# Patient Record
Sex: Male | Born: 1990 | Race: White | Hispanic: No | Marital: Single | Smoking: Current every day smoker
Health system: Southern US, Community
[De-identification: ages and names within clinical notes are randomized; demographics above are authoritative.]

## PROBLEM LIST (undated history)

## (undated) DIAGNOSIS — W3400XA Accidental discharge from unspecified firearms or gun, initial encounter: Secondary | ICD-10-CM

## (undated) DIAGNOSIS — M792 Neuralgia and neuritis, unspecified: Secondary | ICD-10-CM

## (undated) DIAGNOSIS — S32009A Unspecified fracture of unspecified lumbar vertebra, initial encounter for closed fracture: Secondary | ICD-10-CM

## (undated) DIAGNOSIS — S36113A Laceration of liver, unspecified degree, initial encounter: Secondary | ICD-10-CM

## (undated) DIAGNOSIS — F1290 Cannabis use, unspecified, uncomplicated: Secondary | ICD-10-CM

---
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
## 2014-08-05 ENCOUNTER — Inpatient Hospital Stay (HOSPITAL_COMMUNITY)
Admission: EM | Admit: 2014-08-05 | Discharge: 2014-08-09 | DRG: 405 | Disposition: A | Discharge status: Home / Self Care | Payer: Self-pay | Attending: General Surgery | Admitting: General Surgery

## 2014-08-05 ENCOUNTER — Emergency Department (HOSPITAL_COMMUNITY): Payer: Self-pay

## 2014-08-05 ENCOUNTER — Encounter (HOSPITAL_COMMUNITY): Payer: Self-pay

## 2014-08-05 ENCOUNTER — Encounter (HOSPITAL_COMMUNITY): Admission: EM | Disposition: A | Discharge status: Home / Self Care | Payer: Self-pay | Source: Home / Self Care

## 2014-08-05 ENCOUNTER — Emergency Department (HOSPITAL_COMMUNITY): Payer: Self-pay | Admitting: Anesthesiology

## 2014-08-05 DIAGNOSIS — G629 Polyneuropathy, unspecified: Secondary | ICD-10-CM | POA: Diagnosis not present

## 2014-08-05 DIAGNOSIS — S32009A Unspecified fracture of unspecified lumbar vertebra, initial encounter for closed fracture: Secondary | ICD-10-CM

## 2014-08-05 DIAGNOSIS — S31139A Puncture wound of abdominal wall without foreign body, unspecified quadrant without penetration into peritoneal cavity, initial encounter: Secondary | ICD-10-CM

## 2014-08-05 DIAGNOSIS — W3400XA Accidental discharge from unspecified firearms or gun, initial encounter: Secondary | ICD-10-CM

## 2014-08-05 DIAGNOSIS — M25559 Pain in unspecified hip: Secondary | ICD-10-CM

## 2014-08-05 DIAGNOSIS — M792 Neuralgia and neuritis, unspecified: Secondary | ICD-10-CM | POA: Diagnosis present

## 2014-08-05 DIAGNOSIS — S36113A Laceration of liver, unspecified degree, initial encounter: Secondary | ICD-10-CM

## 2014-08-05 DIAGNOSIS — Y92009 Unspecified place in unspecified non-institutional (private) residence as the place of occurrence of the external cause: Secondary | ICD-10-CM

## 2014-08-05 DIAGNOSIS — Z9889 Other specified postprocedural states: Secondary | ICD-10-CM

## 2014-08-05 DIAGNOSIS — S32039B Unspecified fracture of third lumbar vertebra, initial encounter for open fracture: Secondary | ICD-10-CM | POA: Diagnosis present

## 2014-08-05 DIAGNOSIS — S31619A Laceration without foreign body of abdominal wall, unspecified quadrant with penetration into peritoneal cavity, initial encounter: Secondary | ICD-10-CM | POA: Diagnosis present

## 2014-08-05 HISTORY — DX: Laceration of liver, unspecified degree, initial encounter: S36.113A

## 2014-08-05 HISTORY — DX: Neuralgia and neuritis, unspecified: M79.2

## 2014-08-05 HISTORY — DX: Accidental discharge from unspecified firearms or gun, initial encounter: W34.00XA

## 2014-08-05 HISTORY — PX: LAPAROTOMY: SHX154

## 2014-08-05 HISTORY — DX: Unspecified fracture of unspecified lumbar vertebra, initial encounter for closed fracture: S32.009A

## 2014-08-05 LAB — COMPREHENSIVE METABOLIC PANEL
ALT: 157 U/L — AB (ref 17–63)
AST: 142 U/L — ABNORMAL HIGH (ref 15–41)
Albumin: 4.4 g/dL (ref 3.5–5.0)
Alkaline Phosphatase: 74 U/L (ref 38–126)
Anion gap: 13 (ref 5–15)
BUN: 15 mg/dL (ref 6–20)
CO2: 24 mmol/L (ref 22–32)
CREATININE: 1.1 mg/dL (ref 0.61–1.24)
Calcium: 9.4 mg/dL (ref 8.9–10.3)
Chloride: 101 mmol/L (ref 101–111)
GFR calc Af Amer: >60 mL/min (ref >60)
GFR calc non Af Amer: >60 mL/min (ref >60)
GLUCOSE: 130 mg/dL — AB (ref 65–99)
Potassium: 3.5 mmol/L (ref 3.5–5.1)
SODIUM: 138 mmol/L (ref 135–145)
Total Bilirubin: 0.8 mg/dL (ref 0.3–1.2)
Total Protein: 7.4 g/dL (ref 6.5–8.1)

## 2014-08-05 LAB — I-STAT CHEM 8, ED
BUN: 19 mg/dL (ref 6–20)
Calcium, Ion: 1.11 mmol/L — ABNORMAL LOW (ref 1.12–1.23)
Chloride: 100 mmol/L — ABNORMAL LOW (ref 101–111)
Creatinine, Ser: 1 mg/dL (ref 0.61–1.24)
GLUCOSE: 134 mg/dL — AB (ref 65–99)
HCT: 47 % (ref 39.0–52.0)
HEMOGLOBIN: 16 g/dL (ref 13.0–17.0)
Potassium: 3.2 mmol/L — ABNORMAL LOW (ref 3.5–5.1)
SODIUM: 140 mmol/L (ref 135–145)
TCO2: 24 mmol/L (ref 0–100)

## 2014-08-05 LAB — CBC
HCT: 42.5 % (ref 39.0–52.0)
HEMOGLOBIN: 15.2 g/dL (ref 13.0–17.0)
MCH: 31 pg (ref 26.0–34.0)
MCHC: 35.8 g/dL (ref 30.0–36.0)
MCV: 86.6 fL (ref 78.0–100.0)
Platelets: 236 10*3/uL (ref 150–400)
RBC: 4.91 MIL/uL (ref 4.22–5.81)
RDW: 12.5 % (ref 11.5–15.5)
WBC: 13.1 10*3/uL — AB (ref 4.0–10.5)

## 2014-08-05 LAB — I-STAT CG4 LACTIC ACID, ED: Lactic Acid, Venous: 2.31 mmol/L (ref 0.5–2.0)

## 2014-08-05 LAB — PROTIME-INR
INR: 1.24 (ref 0.00–1.49)
Prothrombin Time: 15.7 seconds — ABNORMAL HIGH (ref 11.6–15.2)

## 2014-08-05 LAB — ETHANOL

## 2014-08-05 LAB — CDS SEROLOGY

## 2014-08-05 SURGERY — LAPAROTOMY, EXPLORATORY
Anesthesia: General | Site: Abdomen

## 2014-08-05 MED ORDER — FENTANYL CITRATE (PF) 250 MCG/5ML IJ SOLN
INTRAMUSCULAR | Status: AC
  Filled 2014-08-05: qty 5

## 2014-08-05 MED ORDER — ROCURONIUM BROMIDE 100 MG/10ML IV SOLN
INTRAVENOUS | Status: DC | PRN
  Administered 2014-08-05: 50 mg via INTRAVENOUS

## 2014-08-05 MED ORDER — 0.9 % SODIUM CHLORIDE (POUR BTL) OPTIME
TOPICAL | Status: DC | PRN
  Administered 2014-08-05: 2000 mL

## 2014-08-05 MED ORDER — MIDAZOLAM HCL 2 MG/2ML IJ SOLN
INTRAMUSCULAR | Status: AC
  Filled 2014-08-05: qty 2

## 2014-08-05 MED ORDER — DEXTROSE 5 % IV SOLN
2.0000 g | INTRAVENOUS | Status: DC | PRN
  Administered 2014-08-05: 2 g via INTRAVENOUS

## 2014-08-05 MED ORDER — HYDROMORPHONE HCL 1 MG/ML IJ SOLN
0.2500 mg | INTRAMUSCULAR | Status: DC | PRN
  Administered 2014-08-05 – 2014-08-06 (×4): 0.5 mg via INTRAVENOUS

## 2014-08-05 MED ORDER — HEMOSTATIC AGENTS (NO CHARGE) OPTIME
TOPICAL | Status: DC | PRN
  Administered 2014-08-05: 2 via TOPICAL

## 2014-08-05 MED ORDER — LACTATED RINGERS IV SOLN
INTRAVENOUS | Status: DC | PRN
  Administered 2014-08-05 (×2): via INTRAVENOUS

## 2014-08-05 MED ORDER — SODIUM CHLORIDE 0.9 % IV SOLN
INTRAVENOUS | Status: DC | PRN
  Administered 2014-08-05: 23:00:00 via INTRAVENOUS

## 2014-08-05 MED ORDER — PROMETHAZINE HCL 25 MG/ML IJ SOLN
6.2500 mg | INTRAMUSCULAR | Status: DC | PRN
Start: 2014-08-05 — End: 2014-08-06
  Filled 2014-08-05: qty 1

## 2014-08-05 MED ORDER — LIDOCAINE HCL (CARDIAC) 20 MG/ML IV SOLN
INTRAVENOUS | Status: DC | PRN
  Administered 2014-08-05: 100 mg via INTRAVENOUS

## 2014-08-05 MED ORDER — FENTANYL CITRATE (PF) 250 MCG/5ML IJ SOLN
INTRAMUSCULAR | Status: DC | PRN
  Administered 2014-08-05: 100 ug via INTRAVENOUS
  Administered 2014-08-05: 150 ug via INTRAVENOUS

## 2014-08-05 MED ORDER — SODIUM CHLORIDE 0.9 % IV BOLUS (SEPSIS)
1000.0000 mL | Freq: Once | INTRAVENOUS | Status: AC
  Administered 2014-08-05: 1000 mL via INTRAVENOUS

## 2014-08-05 MED ORDER — CEFOTETAN DISODIUM-DEXTROSE 2-2.08 GM-% IV SOLR
INTRAVENOUS | Status: AC
  Filled 2014-08-05: qty 50

## 2014-08-05 MED ORDER — HYDROMORPHONE HCL 1 MG/ML IJ SOLN
INTRAMUSCULAR | Status: AC
  Administered 2014-08-06: 2 mg via INTRAVENOUS
  Filled 2014-08-05: qty 1

## 2014-08-05 MED ORDER — SUGAMMADEX SODIUM 500 MG/5ML IV SOLN
INTRAVENOUS | Status: AC
  Filled 2014-08-05: qty 5

## 2014-08-05 MED ORDER — ROCURONIUM BROMIDE 100 MG/10ML IV SOLN: INTRAVENOUS | Status: DC | PRN

## 2014-08-05 MED ORDER — SUCCINYLCHOLINE CHLORIDE 20 MG/ML IJ SOLN
INTRAMUSCULAR | Status: DC | PRN
  Administered 2014-08-05: 120 mg via INTRAVENOUS

## 2014-08-05 MED ORDER — MIDAZOLAM HCL 5 MG/5ML IJ SOLN
INTRAMUSCULAR | Status: DC | PRN
  Administered 2014-08-05: 2 mg via INTRAVENOUS

## 2014-08-05 MED ORDER — SUGAMMADEX SODIUM 200 MG/2ML IV SOLN
INTRAVENOUS | Status: DC | PRN
  Administered 2014-08-05: 260 mg via INTRAVENOUS

## 2014-08-05 MED ORDER — PROPOFOL 10 MG/ML IV BOLUS
INTRAVENOUS | Status: DC | PRN
  Administered 2014-08-05: 200 mg via INTRAVENOUS

## 2014-08-05 SURGICAL SUPPLY — 46 items
BLADE SURG ROTATE 9660 (MISCELLANEOUS) IMPLANT
CANISTER SUCTION 2500CC (MISCELLANEOUS) ×3 IMPLANT
CHLORAPREP W/TINT 26ML (MISCELLANEOUS) ×3 IMPLANT
COVER MAYO STAND STRL (DRAPES) IMPLANT
COVER SURGICAL LIGHT HANDLE (MISCELLANEOUS) ×3 IMPLANT
DRAPE LAPAROSCOPIC ABDOMINAL (DRAPES) ×3 IMPLANT
DRAPE PROXIMA HALF (DRAPES) IMPLANT
DRAPE UTILITY XL STRL (DRAPES) ×6 IMPLANT
DRAPE WARM FLUID 44X44 (DRAPE) ×3 IMPLANT
DRSG OPSITE POSTOP 4X10 (GAUZE/BANDAGES/DRESSINGS) ×3 IMPLANT
DRSG OPSITE POSTOP 4X8 (GAUZE/BANDAGES/DRESSINGS) IMPLANT
ELECT BLADE 6.5 EXT (BLADE) IMPLANT
ELECT CAUTERY BLADE 6.4 (BLADE) ×6 IMPLANT
ELECT REM PT RETURN 9FT ADLT (ELECTROSURGICAL) ×3
ELECTRODE REM PT RTRN 9FT ADLT (ELECTROSURGICAL) ×1 IMPLANT
GAUZE SPONGE 2X2 8PLY STRL LF (GAUZE/BANDAGES/DRESSINGS) ×1 IMPLANT
GLOVE BIO SURGEON STRL SZ7.5 (GLOVE) ×3 IMPLANT
GLOVE BIOGEL PI IND STRL 8 (GLOVE) ×1 IMPLANT
GLOVE BIOGEL PI INDICATOR 8 (GLOVE) ×2
GOWN STRL REUS W/ TWL LRG LVL3 (GOWN DISPOSABLE) ×2 IMPLANT
GOWN STRL REUS W/ TWL XL LVL3 (GOWN DISPOSABLE) ×1 IMPLANT
GOWN STRL REUS W/TWL LRG LVL3 (GOWN DISPOSABLE) ×4
GOWN STRL REUS W/TWL XL LVL3 (GOWN DISPOSABLE) ×2
KIT BASIN OR (CUSTOM PROCEDURE TRAY) ×3 IMPLANT
KIT ROOM TURNOVER OR (KITS) ×3 IMPLANT
LIGASURE IMPACT 36 18CM CVD LR (INSTRUMENTS) IMPLANT
NS IRRIG 1000ML POUR BTL (IV SOLUTION) ×6 IMPLANT
PACK GENERAL/GYN (CUSTOM PROCEDURE TRAY) ×3 IMPLANT
PAD ARMBOARD 7.5X6 YLW CONV (MISCELLANEOUS) ×6 IMPLANT
PENCIL BUTTON HOLSTER BLD 10FT (ELECTRODE) IMPLANT
SPECIMEN JAR LARGE (MISCELLANEOUS) IMPLANT
SPONGE GAUZE 2X2 STER 10/PKG (GAUZE/BANDAGES/DRESSINGS) ×2
SPONGE LAP 18X18 X RAY DECT (DISPOSABLE) IMPLANT
STAPLER VISISTAT 35W (STAPLE) ×3 IMPLANT
SUCTION POOLE TIP (SUCTIONS) ×3 IMPLANT
SUT PDS AB 1 TP1 96 (SUTURE) ×6 IMPLANT
SUT SILK 2 0 SH CR/8 (SUTURE) ×3 IMPLANT
SUT SILK 2 0 TIES 10X30 (SUTURE) ×3 IMPLANT
SUT SILK 3 0 SH CR/8 (SUTURE) ×3 IMPLANT
SUT SILK 3 0 TIES 10X30 (SUTURE) ×3 IMPLANT
TAPE CLOTH SURG 4X10 WHT LF (GAUZE/BANDAGES/DRESSINGS) ×3 IMPLANT
TOWEL OR 17X26 10 PK STRL BLUE (TOWEL DISPOSABLE) ×3 IMPLANT
TRAY FOLEY CATH 16FRSI W/METER (SET/KITS/TRAYS/PACK) IMPLANT
TUBE CONNECTING 12'X1/4 (SUCTIONS)
TUBE CONNECTING 12X1/4 (SUCTIONS) IMPLANT
YANKAUER SUCT BULB TIP NO VENT (SUCTIONS) IMPLANT

## 2014-08-05 NOTE — Anesthesia Procedure Notes (Signed)
Procedure Name: Intubation Date/Time: 08/05/2014 10:40 PM Performed by: Brien MatesMAHONY, Shyloh Krinke D Pre-anesthesia Checklist: Patient identified, Emergency Drugs available, Suction available, Patient being monitored and Timeout performed Patient Re-evaluated:Patient Re-evaluated prior to inductionOxygen Delivery Method: Circle system utilized Preoxygenation: Pre-oxygenation with 100% oxygen Intubation Type: Rapid sequence and Cricoid Pressure applied Laryngoscope Size: Mac and 3 Grade View: Grade I Tube type: Subglottic suction tube Tube size: 7.5 mm Number of attempts: 1 Airway Equipment and Method: Stylet Placement Confirmation: ETT inserted through vocal cords under direct vision,  positive ETCO2 and breath sounds checked- equal and bilateral Secured at: 23 cm Tube secured with: Tape Dental Injury: Teeth and Oropharynx as per pre-operative assessment
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
## 2014-08-05 NOTE — ED Notes (Signed)
Pt taken to OR. WDL, NAD. Stable at this time.

## 2014-08-05 NOTE — Transfer of Care (Signed)
Immediate Anesthesia Transfer of Care Note  Patient: Carl Thomas  Procedure(s) Performed: Procedure(s): EXPLORATORY LAPAROTOMY (N/A)  Patient Location: PACU  Anesthesia Type:General  Level of Consciousness: awake, alert  and oriented  Airway & Oxygen Therapy: Patient Spontanous Breathing  Post-op Assessment: Report given to RN and Post -op Vital signs reviewed and stable  Post vital signs: Reviewed and stable  Last Vitals:  Filed Vitals:   08/05/14 2345  BP: 155/106  Pulse: 98  Temp: 36.7 C  Resp: 22    Complications: No apparent anesthesia complications
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
## 2014-08-05 NOTE — ED Notes (Signed)
Second small wound located to right lower side of back
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
## 2014-08-05 NOTE — Anesthesia Preprocedure Evaluation (Addendum)
Anesthesia Evaluation  Patient identified by MRN, date of birth, ID band Patient awake    Reviewed: Allergy & Precautions, NPO status Preop documentation limited or incomplete due to emergent nature of procedure.  Airway Mallampati: II  TM Distance: >3 FB Neck ROM: Full    Dental   Pulmonary  breath sounds clear to auscultation        Cardiovascular Rhythm:Regular Rate:Normal     Neuro/Psych    GI/Hepatic   Endo/Other    Renal/GU      Musculoskeletal   Abdominal   Peds  Hematology   Anesthesia Other Findings   Reproductive/Obstetrics                             Anesthesia Physical Anesthesia Plan  ASA: III and emergent  Anesthesia Plan: General   Post-op Pain Management:    Induction: Intravenous and Rapid sequence  Airway Management Planned: Oral ETT  Additional Equipment:   Intra-op Plan:   Post-operative Plan: Extubation in OR and Possible Post-op intubation/ventilation  Informed Consent: I have reviewed the patients History and Physical, chart, labs and discussed the procedure including the risks, benefits and alternatives for the proposed anesthesia with the patient or authorized representative who has indicated his/her understanding and acceptance.   Dental advisory given  Plan Discussed with: CRNA  Anesthesia Plan Comments:        Anesthesia Quick Evaluation
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
## 2014-08-06 ENCOUNTER — Inpatient Hospital Stay (HOSPITAL_COMMUNITY): Payer: Self-pay

## 2014-08-06 ENCOUNTER — Encounter (HOSPITAL_COMMUNITY): Payer: Self-pay

## 2014-08-06 DIAGNOSIS — Z9889 Other specified postprocedural states: Secondary | ICD-10-CM

## 2014-08-06 LAB — URINALYSIS, ROUTINE W REFLEX MICROSCOPIC
Bilirubin Urine: NEGATIVE
Glucose, UA: NEGATIVE mg/dL
Ketones, ur: 15 mg/dL — AB
LEUKOCYTES UA: NEGATIVE
NITRITE: NEGATIVE
Protein, ur: 30 mg/dL — AB
SPECIFIC GRAVITY, URINE: 1.021 (ref 1.005–1.030)
UROBILINOGEN UA: 0.2 mg/dL (ref 0.0–1.0)
pH: 5 (ref 5.0–8.0)

## 2014-08-06 LAB — RAPID URINE DRUG SCREEN, HOSP PERFORMED
AMPHETAMINES: NOT DETECTED
Barbiturates: NOT DETECTED
Benzodiazepines: POSITIVE — AB
Cocaine: NOT DETECTED
Opiates: POSITIVE — AB
Tetrahydrocannabinol: POSITIVE — AB

## 2014-08-06 LAB — CBC
HEMATOCRIT: 40.7 % (ref 39.0–52.0)
Hemoglobin: 14.2 g/dL (ref 13.0–17.0)
MCH: 30.7 pg (ref 26.0–34.0)
MCHC: 34.9 g/dL (ref 30.0–36.0)
MCV: 87.9 fL (ref 78.0–100.0)
PLATELETS: 200 10*3/uL (ref 150–400)
RBC: 4.63 MIL/uL (ref 4.22–5.81)
RDW: 12.7 % (ref 11.5–15.5)
WBC: 20.9 10*3/uL — ABNORMAL HIGH (ref 4.0–10.5)

## 2014-08-06 LAB — TYPE AND SCREEN
ABO/RH(D): B POS
ANTIBODY SCREEN: NEGATIVE
UNIT DIVISION: 0
UNIT DIVISION: 0

## 2014-08-06 LAB — PREPARE FRESH FROZEN PLASMA
Unit division: 0
Unit division: 0

## 2014-08-06 LAB — BASIC METABOLIC PANEL
ANION GAP: 11 (ref 5–15)
BUN: 14 mg/dL (ref 6–20)
CHLORIDE: 102 mmol/L (ref 101–111)
CO2: 23 mmol/L (ref 22–32)
Calcium: 8.8 mg/dL — ABNORMAL LOW (ref 8.9–10.3)
Creatinine, Ser: 0.94 mg/dL (ref 0.61–1.24)
GFR calc non Af Amer: >60 mL/min (ref >60)
Glucose, Bld: 123 mg/dL — ABNORMAL HIGH (ref 65–99)
Potassium: 4.4 mmol/L (ref 3.5–5.1)
SODIUM: 136 mmol/L (ref 135–145)

## 2014-08-06 LAB — URINE MICROSCOPIC-ADD ON

## 2014-08-06 LAB — ABO/RH: ABO/RH(D): B POS

## 2014-08-06 MED ORDER — ONDANSETRON HCL 4 MG/2ML IJ SOLN
4.0000 mg | Freq: Four times a day (QID) | INTRAMUSCULAR | Status: DC | PRN
  Administered 2014-08-06 – 2014-08-07 (×3): 4 mg via INTRAVENOUS
  Filled 2014-08-06 (×3): qty 2

## 2014-08-06 MED ORDER — DEXTROSE-NACL 5-0.9 % IV SOLN
INTRAVENOUS | Status: DC
  Administered 2014-08-06 – 2014-08-07 (×4): via INTRAVENOUS

## 2014-08-06 MED ORDER — HYDROMORPHONE HCL 1 MG/ML IJ SOLN
0.5000 mg | INTRAMUSCULAR | Status: DC | PRN
  Administered 2014-08-06 – 2014-08-07 (×6): 2 mg via INTRAVENOUS
  Filled 2014-08-06 (×6): qty 2

## 2014-08-06 MED ORDER — NICOTINE 21 MG/24HR TD PT24
21.0000 mg | MEDICATED_PATCH | Freq: Every day | TRANSDERMAL | Status: DC
  Filled 2014-08-06 (×2): qty 1

## 2014-08-06 MED ORDER — HYDROCODONE-ACETAMINOPHEN 5-325 MG PO TABS
2.0000 | ORAL_TABLET | ORAL | Status: DC | PRN
  Administered 2014-08-06 (×3): 2 via ORAL
  Administered 2014-08-06: 1 via ORAL
  Administered 2014-08-07: 2 via ORAL
  Filled 2014-08-06 (×5): qty 2

## 2014-08-06 MED ORDER — IOHEXOL 300 MG/ML  SOLN
100.0000 mL | Freq: Once | INTRAMUSCULAR | Status: AC | PRN
  Administered 2014-08-06: 100 mL via INTRAVENOUS

## 2014-08-06 MED ORDER — DOUBLE ANTIBIOTIC 500-10000 UNIT/GM EX OINT
TOPICAL_OINTMENT | Freq: Two times a day (BID) | CUTANEOUS | Status: DC
  Administered 2014-08-06 – 2014-08-09 (×6): via TOPICAL
  Filled 2014-08-06 (×20): qty 1

## 2014-08-06 MED ORDER — HYDROMORPHONE HCL 1 MG/ML IJ SOLN
INTRAMUSCULAR | Status: AC
  Administered 2014-08-06: 2 mg via INTRAVENOUS
  Filled 2014-08-06: qty 1

## 2014-08-06 MED ORDER — PROPOFOL 10 MG/ML IV BOLUS
INTRAVENOUS | Status: AC
  Filled 2014-08-06: qty 20

## 2014-08-06 MED ORDER — METHOCARBAMOL 500 MG PO TABS
1000.0000 mg | ORAL_TABLET | Freq: Three times a day (TID) | ORAL | Status: DC
  Administered 2014-08-06 (×3): 1000 mg via ORAL
  Filled 2014-08-06 (×3): qty 2

## 2014-08-06 MED ORDER — ONDANSETRON HCL 4 MG PO TABS: 4.0000 mg | ORAL_TABLET | Freq: Four times a day (QID) | ORAL | Status: DC | PRN

## 2014-08-06 MED ORDER — HYDROMORPHONE HCL 1 MG/ML IJ SOLN
1.0000 mg | INTRAMUSCULAR | Status: DC | PRN
  Administered 2014-08-06 (×2): 1 mg via INTRAVENOUS
  Filled 2014-08-06 (×2): qty 1

## 2014-08-06 MED ORDER — PNEUMOCOCCAL VAC POLYVALENT 25 MCG/0.5ML IJ INJ: 0.5000 mL | INJECTION | INTRAMUSCULAR | Status: DC
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
## 2014-08-06 NOTE — Plan of Care (Signed)
Problem: Phase I Progression Outcomes Goal: Pain controlled with appropriate interventions Outcome: Progressing Poor pain control, but pain is mostly in right hip
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
## 2014-08-06 NOTE — Anesthesia Postprocedure Evaluation (Signed)
  Anesthesia Post-op Note  Patient: Carl Thomas  Procedure(s) Performed: Procedure(s): EXPLORATORY LAPAROTOMY (N/A)  Patient Location: PACU  Anesthesia Type:General  Level of Consciousness: awake and alert   Airway and Oxygen Therapy: Patient Spontanous Breathing  Post-op Pain: moderate  Post-op Assessment: Post-op Vital signs reviewed              Post-op Vital Signs: Reviewed  Last Vitals:  Filed Vitals:   08/06/14 0510  BP: 136/80  Pulse: 78  Temp: 37 C  Resp: 18    Complications: No apparent anesthesia complications

## 2014-08-06 NOTE — Progress Notes (Signed)
Patient refused to let me touch him for assessment. He said he hurt to bad.  I did look at his wounds, and there is no drainage at this time.  I also have not done the admission, cause he wanted to go to sleep. I will try later on in the night.

## 2014-08-07 ENCOUNTER — Encounter (HOSPITAL_COMMUNITY): Payer: Self-pay | Admitting: General Surgery

## 2014-08-07 DIAGNOSIS — S32009A Unspecified fracture of unspecified lumbar vertebra, initial encounter for closed fracture: Secondary | ICD-10-CM | POA: Diagnosis present

## 2014-08-07 DIAGNOSIS — S31139A Puncture wound of abdominal wall without foreign body, unspecified quadrant without penetration into peritoneal cavity, initial encounter: Secondary | ICD-10-CM

## 2014-08-07 DIAGNOSIS — S36113A Laceration of liver, unspecified degree, initial encounter: Secondary | ICD-10-CM | POA: Diagnosis present

## 2014-08-07 DIAGNOSIS — M792 Neuralgia and neuritis, unspecified: Secondary | ICD-10-CM | POA: Diagnosis present

## 2014-08-07 DIAGNOSIS — W3400XA Accidental discharge from unspecified firearms or gun, initial encounter: Secondary | ICD-10-CM

## 2014-08-07 LAB — BASIC METABOLIC PANEL
Anion gap: 7 (ref 5–15)
BUN: 5 mg/dL — ABNORMAL LOW (ref 6–20)
CALCIUM: 8.8 mg/dL — AB (ref 8.9–10.3)
CHLORIDE: 100 mmol/L — AB (ref 101–111)
CO2: 30 mmol/L (ref 22–32)
Creatinine, Ser: 0.91 mg/dL (ref 0.61–1.24)
Glucose, Bld: 122 mg/dL — ABNORMAL HIGH (ref 65–99)
POTASSIUM: 3.7 mmol/L (ref 3.5–5.1)
SODIUM: 137 mmol/L (ref 135–145)

## 2014-08-07 LAB — CBC
HCT: 37.3 % — ABNORMAL LOW (ref 39.0–52.0)
Hemoglobin: 13.1 g/dL (ref 13.0–17.0)
MCH: 30.6 pg (ref 26.0–34.0)
MCHC: 35.1 g/dL (ref 30.0–36.0)
MCV: 87.1 fL (ref 78.0–100.0)
Platelets: 211 10*3/uL (ref 150–400)
RBC: 4.28 MIL/uL (ref 4.22–5.81)
RDW: 12.7 % (ref 11.5–15.5)
WBC: 13.3 10*3/uL — ABNORMAL HIGH (ref 4.0–10.5)

## 2014-08-07 LAB — BLOOD PRODUCT ORDER (VERBAL) VERIFICATION

## 2014-08-07 MED ORDER — ENOXAPARIN SODIUM 40 MG/0.4ML ~~LOC~~ SOLN
40.0000 mg | SUBCUTANEOUS | Status: DC
  Administered 2014-08-07 – 2014-08-09 (×3): 40 mg via SUBCUTANEOUS
  Filled 2014-08-07 (×3): qty 0.4

## 2014-08-07 MED ORDER — OXYCODONE HCL 5 MG PO TABS
10.0000 mg | ORAL_TABLET | ORAL | Status: DC | PRN
  Administered 2014-08-07: 15 mg via ORAL
  Administered 2014-08-07 – 2014-08-09 (×9): 20 mg via ORAL
  Filled 2014-08-07 (×3): qty 4
  Filled 2014-08-07: qty 3
  Filled 2014-08-07 (×6): qty 4

## 2014-08-07 MED ORDER — PREGABALIN 75 MG PO CAPS
75.0000 mg | ORAL_CAPSULE | Freq: Two times a day (BID) | ORAL | Status: DC
  Administered 2014-08-07 – 2014-08-09 (×5): 75 mg via ORAL
  Filled 2014-08-07 (×5): qty 1

## 2014-08-07 MED ORDER — NAPROXEN 250 MG PO TABS
500.0000 mg | ORAL_TABLET | Freq: Two times a day (BID) | ORAL | Status: DC
  Administered 2014-08-07 – 2014-08-09 (×5): 500 mg via ORAL
  Filled 2014-08-07 (×5): qty 2

## 2014-08-07 MED ORDER — POLYETHYLENE GLYCOL 3350 17 G PO PACK
17.0000 g | PACK | Freq: Every day | ORAL | Status: DC
  Administered 2014-08-07 – 2014-08-09 (×3): 17 g via ORAL
  Filled 2014-08-07 (×3): qty 1

## 2014-08-07 MED ORDER — DOCUSATE SODIUM 100 MG PO CAPS
100.0000 mg | ORAL_CAPSULE | Freq: Two times a day (BID) | ORAL | Status: DC
  Administered 2014-08-07 – 2014-08-09 (×5): 100 mg via ORAL
  Filled 2014-08-07 (×5): qty 1

## 2014-08-07 MED ORDER — HYDROMORPHONE HCL 1 MG/ML IJ SOLN
1.0000 mg | INTRAMUSCULAR | Status: DC | PRN
  Administered 2014-08-07 – 2014-08-08 (×6): 1 mg via INTRAVENOUS
  Filled 2014-08-07 (×6): qty 1
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
## 2014-08-07 NOTE — Progress Notes (Signed)
Utilization Review Completed.Carl Thomas T7/11/2014  

## 2014-08-08 MED ORDER — LIDOCAINE 5 % EX PTCH
1.0000 | MEDICATED_PATCH | CUTANEOUS | Status: DC
  Administered 2014-08-08 – 2014-08-09 (×2): 1 via TRANSDERMAL
  Filled 2014-08-08 (×2): qty 1

## 2014-08-08 MED ORDER — TRAMADOL HCL 50 MG PO TABS
100.0000 mg | ORAL_TABLET | Freq: Four times a day (QID) | ORAL | Status: DC
  Administered 2014-08-08 – 2014-08-09 (×6): 100 mg via ORAL
  Filled 2014-08-08 (×6): qty 2
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
## 2014-08-09 ENCOUNTER — Encounter: Payer: Self-pay | Admitting: Orthopedic Surgery

## 2014-08-09 MED ORDER — PREGABALIN 75 MG PO CAPS: 75.0000 mg | ORAL_CAPSULE | Freq: Two times a day (BID) | ORAL | Status: DC

## 2014-08-09 MED ORDER — OXYCODONE-ACETAMINOPHEN 10-325 MG PO TABS: 1.0000 | ORAL_TABLET | ORAL | Status: DC | PRN

## 2014-08-09 MED ORDER — LIDOCAINE 5 % EX PTCH: 1.0000 | MEDICATED_PATCH | CUTANEOUS | Status: DC

## 2014-08-09 MED ORDER — NAPROXEN 500 MG PO TABS: 500.0000 mg | ORAL_TABLET | Freq: Two times a day (BID) | ORAL | Status: DC

## 2014-08-09 MED ORDER — TRAMADOL HCL 50 MG PO TABS: 100.0000 mg | ORAL_TABLET | Freq: Four times a day (QID) | ORAL | Status: DC

## 2014-08-09 MED ORDER — HYDROMORPHONE HCL 1 MG/ML IJ SOLN: 0.5000 mg | INTRAMUSCULAR | Status: DC | PRN

## 2014-08-09 NOTE — Progress Notes (Signed)
Discharge home. Home discharge  Instruction given, no question verbalized. 
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
## 2014-08-09 NOTE — Discharge Instructions (Signed)
Wash wounds daily in shower with soap and water. °Do not soak. °Apply antibiotic ointment (e.g. Neosporin) twice daily and as needed to keep moist. °Cover with dry dressing. ° °No lifting more than 5 pounds for 6 weeks. ° °No driving while taking oxycodone. °
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
## 2014-08-13 ENCOUNTER — Emergency Department (HOSPITAL_COMMUNITY): Payer: Self-pay

## 2014-08-13 ENCOUNTER — Encounter (HOSPITAL_COMMUNITY): Payer: Self-pay | Admitting: Emergency Medicine

## 2014-08-13 ENCOUNTER — Inpatient Hospital Stay (HOSPITAL_COMMUNITY)
Admission: EM | Admit: 2014-08-13 | Discharge: 2014-08-15 | DRG: 372 | Discharge status: Against Medical Advice | Payer: Self-pay | Attending: General Surgery | Admitting: General Surgery

## 2014-08-13 DIAGNOSIS — R112 Nausea with vomiting, unspecified: Secondary | ICD-10-CM

## 2014-08-13 DIAGNOSIS — W3400XD Accidental discharge from unspecified firearms or gun, subsequent encounter: Secondary | ICD-10-CM

## 2014-08-13 DIAGNOSIS — S31139D Puncture wound of abdominal wall without foreign body, unspecified quadrant without penetration into peritoneal cavity, subsequent encounter: Secondary | ICD-10-CM

## 2014-08-13 DIAGNOSIS — F17210 Nicotine dependence, cigarettes, uncomplicated: Secondary | ICD-10-CM | POA: Diagnosis present

## 2014-08-13 DIAGNOSIS — G8918 Other acute postprocedural pain: Secondary | ICD-10-CM

## 2014-08-13 DIAGNOSIS — R109 Unspecified abdominal pain: Secondary | ICD-10-CM | POA: Diagnosis present

## 2014-08-13 DIAGNOSIS — A0472 Enterocolitis due to Clostridium difficile, not specified as recurrent: Secondary | ICD-10-CM | POA: Diagnosis present

## 2014-08-13 DIAGNOSIS — S36113D Laceration of liver, unspecified degree, subsequent encounter: Secondary | ICD-10-CM

## 2014-08-13 DIAGNOSIS — A047 Enterocolitis due to Clostridium difficile: Principal | ICD-10-CM | POA: Diagnosis present

## 2014-08-13 DIAGNOSIS — D62 Acute posthemorrhagic anemia: Secondary | ICD-10-CM | POA: Diagnosis present

## 2014-08-13 DIAGNOSIS — F1290 Cannabis use, unspecified, uncomplicated: Secondary | ICD-10-CM | POA: Diagnosis present

## 2014-08-13 HISTORY — DX: Neuralgia and neuritis, unspecified: M79.2

## 2014-08-13 HISTORY — DX: Unspecified fracture of unspecified lumbar vertebra, initial encounter for closed fracture: S32.009A

## 2014-08-13 HISTORY — DX: Accidental discharge from unspecified firearms or gun, initial encounter: W34.00XA

## 2014-08-13 HISTORY — DX: Laceration of liver, unspecified degree, initial encounter: S36.113A

## 2014-08-13 HISTORY — DX: Cannabis use, unspecified, uncomplicated: F12.90

## 2014-08-13 LAB — CBC WITH DIFFERENTIAL/PLATELET
BASOS ABS: 0 10*3/uL (ref 0.0–0.1)
Basophils Relative: 0 % (ref 0–1)
EOS PCT: 1 % (ref 0–5)
Eosinophils Absolute: 0.1 10*3/uL (ref 0.0–0.7)
HCT: 36.9 % — ABNORMAL LOW (ref 39.0–52.0)
Hemoglobin: 13 g/dL (ref 13.0–17.0)
LYMPHS PCT: 5 % — AB (ref 12–46)
Lymphs Abs: 0.8 10*3/uL (ref 0.7–4.0)
MCH: 30.6 pg (ref 26.0–34.0)
MCHC: 35.2 g/dL (ref 30.0–36.0)
MCV: 86.8 fL (ref 78.0–100.0)
MONO ABS: 1.5 10*3/uL — AB (ref 0.1–1.0)
Monocytes Relative: 10 % (ref 3–12)
Neutro Abs: 12.3 10*3/uL — ABNORMAL HIGH (ref 1.7–7.7)
Neutrophils Relative %: 84 % — ABNORMAL HIGH (ref 43–77)
Platelets: 315 10*3/uL (ref 150–400)
RBC: 4.25 MIL/uL (ref 4.22–5.81)
RDW: 12.4 % (ref 11.5–15.5)
WBC: 14.7 10*3/uL — AB (ref 4.0–10.5)

## 2014-08-13 LAB — COMPREHENSIVE METABOLIC PANEL
ALK PHOS: 124 U/L (ref 38–126)
ALT: 79 U/L — ABNORMAL HIGH (ref 17–63)
AST: 41 U/L (ref 15–41)
Albumin: 3.7 g/dL (ref 3.5–5.0)
Anion gap: 10 (ref 5–15)
BUN: 20 mg/dL (ref 6–20)
CO2: 29 mmol/L (ref 22–32)
CREATININE: 0.75 mg/dL (ref 0.61–1.24)
Calcium: 8.7 mg/dL — ABNORMAL LOW (ref 8.9–10.3)
Chloride: 95 mmol/L — ABNORMAL LOW (ref 101–111)
GFR calc Af Amer: >60 mL/min (ref >60)
GFR calc non Af Amer: >60 mL/min (ref >60)
Glucose, Bld: 113 mg/dL — ABNORMAL HIGH (ref 65–99)
POTASSIUM: 4.1 mmol/L (ref 3.5–5.1)
Sodium: 134 mmol/L — ABNORMAL LOW (ref 135–145)
TOTAL PROTEIN: 7.6 g/dL (ref 6.5–8.1)
Total Bilirubin: 0.8 mg/dL (ref 0.3–1.2)

## 2014-08-13 LAB — LIPASE, BLOOD: Lipase: <10 U/L — ABNORMAL LOW (ref 22–51)

## 2014-08-13 MED ORDER — ONDANSETRON HCL 4 MG/2ML IJ SOLN: 4.0000 mg | Freq: Four times a day (QID) | INTRAMUSCULAR | Status: DC | PRN

## 2014-08-13 MED ORDER — ONDANSETRON HCL 4 MG/2ML IJ SOLN
4.0000 mg | INTRAMUSCULAR | Status: AC | PRN
  Administered 2014-08-13 – 2014-08-14 (×2): 4 mg via INTRAVENOUS
  Filled 2014-08-13: qty 2

## 2014-08-13 MED ORDER — DEXTROSE-NACL 5-0.9 % IV SOLN
INTRAVENOUS | Status: DC
  Administered 2014-08-14: via INTRAVENOUS

## 2014-08-13 MED ORDER — ENOXAPARIN SODIUM 40 MG/0.4ML ~~LOC~~ SOLN
40.0000 mg | SUBCUTANEOUS | Status: DC
  Administered 2014-08-14 – 2014-08-15 (×2): 40 mg via SUBCUTANEOUS
  Filled 2014-08-13 (×2): qty 0.4

## 2014-08-13 MED ORDER — IOHEXOL 350 MG/ML SOLN
100.0000 mL | Freq: Once | INTRAVENOUS | Status: AC | PRN
  Administered 2014-08-13: 100 mL via INTRAVENOUS

## 2014-08-13 MED ORDER — HYDROMORPHONE HCL 1 MG/ML IJ SOLN
1.0000 mg | INTRAMUSCULAR | Status: DC | PRN
  Administered 2014-08-14 (×4): 1 mg via INTRAVENOUS
  Filled 2014-08-13 (×6): qty 1

## 2014-08-13 MED ORDER — SODIUM CHLORIDE 0.9 % IV SOLN
INTRAVENOUS | Status: DC
  Administered 2014-08-13 – 2014-08-14 (×2): via INTRAVENOUS

## 2014-08-13 MED ORDER — SODIUM CHLORIDE 0.9 % IV BOLUS (SEPSIS)
1000.0000 mL | Freq: Once | INTRAVENOUS | Status: AC
  Administered 2014-08-13: 1000 mL via INTRAVENOUS

## 2014-08-13 MED ORDER — SODIUM CHLORIDE 0.9 % IV BOLUS (SEPSIS): 500.0000 mL | Freq: Once | INTRAVENOUS | Status: DC

## 2014-08-13 MED ORDER — FENTANYL CITRATE (PF) 100 MCG/2ML IJ SOLN
50.0000 ug | INTRAMUSCULAR | Status: AC | PRN
  Administered 2014-08-13 (×2): 50 ug via INTRAVENOUS
  Filled 2014-08-13 (×2): qty 2

## 2014-08-13 MED ORDER — MORPHINE SULFATE 4 MG/ML IJ SOLN: 4.0000 mg | INTRAMUSCULAR | Status: DC | PRN

## 2014-08-13 MED ORDER — ONDANSETRON HCL 4 MG/2ML IJ SOLN
4.0000 mg | INTRAMUSCULAR | Status: DC | PRN
  Administered 2014-08-14: 4 mg via INTRAVENOUS
  Filled 2014-08-13 (×2): qty 2

## 2014-08-13 MED ORDER — FENTANYL CITRATE (PF) 100 MCG/2ML IJ SOLN: 50.0000 ug | INTRAMUSCULAR | Status: DC | PRN

## 2014-08-13 MED ORDER — ONDANSETRON HCL 4 MG PO TABS: 4.0000 mg | ORAL_TABLET | Freq: Four times a day (QID) | ORAL | Status: DC | PRN

## 2014-08-13 NOTE — ED Notes (Signed)
Patient transported to CT 

## 2014-08-13 NOTE — ED Notes (Signed)
MD at bedside. 
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
## 2014-08-13 NOTE — ED Notes (Signed)
Upon trying to enter the room, patient would not let this RN in, stated he needed a doctor. PA Allyne GeeSanders who is caring for patient made aware and in to talk with patient. Upon entering the room, patient was found defecating on the trash can in the room. Pt stated that he could not control his BM.
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
## 2014-08-14 ENCOUNTER — Encounter (HOSPITAL_COMMUNITY): Payer: Self-pay

## 2014-08-14 DIAGNOSIS — R109 Unspecified abdominal pain: Secondary | ICD-10-CM | POA: Diagnosis present

## 2014-08-14 LAB — CBC
HCT: 33.4 % — ABNORMAL LOW (ref 39.0–52.0)
HCT: 34.1 % — ABNORMAL LOW (ref 39.0–52.0)
HEMOGLOBIN: 11.6 g/dL — AB (ref 13.0–17.0)
Hemoglobin: 12 g/dL — ABNORMAL LOW (ref 13.0–17.0)
MCH: 30 pg (ref 26.0–34.0)
MCH: 30.2 pg (ref 26.0–34.0)
MCHC: 34.7 g/dL (ref 30.0–36.0)
MCHC: 35.2 g/dL (ref 30.0–36.0)
MCV: 85.7 fL (ref 78.0–100.0)
MCV: 86.3 fL (ref 78.0–100.0)
PLATELETS: 285 10*3/uL (ref 150–400)
PLATELETS: 295 10*3/uL (ref 150–400)
RBC: 3.87 MIL/uL — AB (ref 4.22–5.81)
RBC: 3.98 MIL/uL — ABNORMAL LOW (ref 4.22–5.81)
RDW: 12.6 % (ref 11.5–15.5)
RDW: 12.4 % (ref 11.5–15.5)
WBC: 12.3 10*3/uL — ABNORMAL HIGH (ref 4.0–10.5)
WBC: 14.5 10*3/uL — AB (ref 4.0–10.5)

## 2014-08-14 LAB — URINALYSIS, ROUTINE W REFLEX MICROSCOPIC
BILIRUBIN URINE: NEGATIVE
GLUCOSE, UA: NEGATIVE mg/dL
HGB URINE DIPSTICK: NEGATIVE
Ketones, ur: NEGATIVE mg/dL
LEUKOCYTES UA: NEGATIVE
NITRITE: NEGATIVE
PH: 6.5 (ref 5.0–8.0)
Protein, ur: NEGATIVE mg/dL
SPECIFIC GRAVITY, URINE: 1.004 — AB (ref 1.005–1.030)
Urobilinogen, UA: 0.2 mg/dL (ref 0.0–1.0)

## 2014-08-14 LAB — COMPREHENSIVE METABOLIC PANEL
ALK PHOS: 106 U/L (ref 38–126)
ALT: 72 U/L — AB (ref 17–63)
AST: 51 U/L — AB (ref 15–41)
Albumin: 2.7 g/dL — ABNORMAL LOW (ref 3.5–5.0)
Anion gap: 7 (ref 5–15)
BUN: 13 mg/dL (ref 6–20)
CALCIUM: 8.7 mg/dL — AB (ref 8.9–10.3)
CHLORIDE: 101 mmol/L (ref 101–111)
CO2: 27 mmol/L (ref 22–32)
CREATININE: 0.72 mg/dL (ref 0.61–1.24)
GFR calc non Af Amer: >60 mL/min (ref >60)
Glucose, Bld: 126 mg/dL — ABNORMAL HIGH (ref 65–99)
Potassium: 3.8 mmol/L (ref 3.5–5.1)
Sodium: 135 mmol/L (ref 135–145)
TOTAL PROTEIN: 6.3 g/dL — AB (ref 6.5–8.1)
Total Bilirubin: 0.4 mg/dL (ref 0.3–1.2)

## 2014-08-14 LAB — CLOSTRIDIUM DIFFICILE BY PCR: CDIFFPCR: POSITIVE — AB

## 2014-08-14 MED ORDER — VANCOMYCIN 50 MG/ML ORAL SOLUTION
125.0000 mg | Freq: Four times a day (QID) | ORAL | Status: DC
  Administered 2014-08-14 – 2014-08-15 (×4): 125 mg via ORAL
  Filled 2014-08-14 (×7): qty 2.5

## 2014-08-14 MED ORDER — PROMETHAZINE HCL 25 MG/ML IJ SOLN
12.5000 mg | Freq: Four times a day (QID) | INTRAMUSCULAR | Status: DC | PRN
  Administered 2014-08-14: 12.5 mg via INTRAVENOUS
  Filled 2014-08-14: qty 1

## 2014-08-14 MED ORDER — ACETAMINOPHEN 325 MG PO TABS
650.0000 mg | ORAL_TABLET | Freq: Four times a day (QID) | ORAL | Status: DC | PRN
  Administered 2014-08-14: 650 mg via ORAL
  Filled 2014-08-14 (×2): qty 2

## 2014-08-14 MED ORDER — VANCOMYCIN 50 MG/ML ORAL SOLUTION
125.0000 mg | Freq: Four times a day (QID) | ORAL | Status: DC
  Administered 2014-08-14: 125 mg via ORAL
  Filled 2014-08-14 (×5): qty 2.5

## 2014-08-14 MED ORDER — ACETAMINOPHEN 650 MG RE SUPP
650.0000 mg | Freq: Four times a day (QID) | RECTAL | Status: DC | PRN
  Administered 2014-08-14: 650 mg via RECTAL
  Filled 2014-08-14: qty 1

## 2014-08-14 MED ORDER — HYDROMORPHONE HCL 1 MG/ML IJ SOLN
1.0000 mg | INTRAMUSCULAR | Status: DC | PRN
  Administered 2014-08-14 – 2014-08-15 (×3): 1 mg via INTRAVENOUS
  Filled 2014-08-14 (×2): qty 1

## 2014-08-14 NOTE — Progress Notes (Signed)
Patient ID: Carl Thomas, male   DOB: 10/29/1990, 24 y.o.   MRN: [id number]  LOS: 2 days POD#9  Subjective: Feels awful. Emesis + diarrhea 5 min before I arrived.    Objective: Vital signs in last 24 hours: Temp:  [98 F (36.7 C)-102 F (38.9 C)] 102 F (38.9 C) (07/18 0554) Pulse Rate:  [88-100] 98 (07/18 0554) Resp:  [17-28] 18 (07/18 0554) BP: (96-120)/(53-82) 111/55 mmHg (07/18 0554) SpO2:  [96 %-100 %] 98 % (07/18 0554) Weight:  [64.1 kg (141 lb 5 oz)-65.772 kg (145 lb)] 64.1 kg (141 lb 5 oz) (07/18 0009) Last BM Date: 08/14/14   Laboratory  CBC  Recent Labs  08/13/14 2355 08/14/14 0418  WBC 14.5* 12.3*  HGB 12.0* 11.6*  HCT 34.1* 33.4*  PLT 285 295   BMET  Recent Labs  08/13/14 1830 08/14/14 0418  NA 134* 135  K 4.1 3.8  CL 95* 101  CO2 29 27  GLUCOSE 113* 126*  BUN 20 13  CREATININE 0.75 0.72  CALCIUM 8.7* 8.7*   Hepatic Function Latest Ref Rng 08/14/2014 08/13/2014 08/05/2014  Total Protein 6.5 - 8.1 g/dL 6.3(L) 7.6 7.4  Albumin 3.5 - 5.0 g/dL 2.7(L) 3.7 4.4  AST 15 - 41 U/L 51(H) 41 142(H)  ALT 17 - 63 U/L 72(H) 79(H) 157(H)  Alk Phosphatase 38 - 126 U/L 106 124 74  Total Bilirubin 0.3 - 1.2 mg/dL 0.4 0.8 0.8    Physical Exam General appearance: alert and no distress Resp: clear to auscultation bilaterally Cardio: regular rate and rhythm GI: Soft, +BS, incision C/D/I   Assessment/Plan: Perihepatic fluid collection -- Biloma vs abscess vs hematoma Diarrhea -- C diff pending ABL anemia -- Improving FEN -- NPO for now, can give diet if no procedures planned VTE -- SCD's, Lovenox Dispo -- Diarrhea    Lisette Abu, PA-C Pager: [phone] General Trauma PA Pager: [phone]  08/14/2014

## 2014-08-14 NOTE — Progress Notes (Signed)
Diluadid  IV given at this time. Unable to scan.(MAR tells me I am locked out). Pharmacy notified

## 2014-08-15 LAB — CBC
HEMATOCRIT: 31.9 % — AB (ref 39.0–52.0)
Hemoglobin: 11 g/dL — ABNORMAL LOW (ref 13.0–17.0)
MCH: 29.7 pg (ref 26.0–34.0)
MCHC: 34.5 g/dL (ref 30.0–36.0)
MCV: 86.2 fL (ref 78.0–100.0)
Platelets: 334 10*3/uL (ref 150–400)
RBC: 3.7 MIL/uL — AB (ref 4.22–5.81)
RDW: 12.5 % (ref 11.5–15.5)
WBC: 11.2 10*3/uL — AB (ref 4.0–10.5)

## 2014-08-15 MED ORDER — NAPROXEN 250 MG PO TABS
500.0000 mg | ORAL_TABLET | Freq: Two times a day (BID) | ORAL | Status: DC
  Administered 2014-08-15: 500 mg via ORAL
  Filled 2014-08-15 (×2): qty 2

## 2014-08-15 MED ORDER — SACCHAROMYCES BOULARDII 250 MG PO CAPS
250.0000 mg | ORAL_CAPSULE | Freq: Two times a day (BID) | ORAL | Status: DC
  Administered 2014-08-15: 250 mg via ORAL
  Filled 2014-08-15 (×2): qty 1

## 2014-08-15 MED ORDER — HYDROMORPHONE HCL 1 MG/ML IJ SOLN: 0.5000 mg | INTRAMUSCULAR | Status: DC | PRN

## 2014-08-15 MED ORDER — OXYCODONE HCL 5 MG PO TABS
5.0000 mg | ORAL_TABLET | ORAL | Status: DC | PRN
  Administered 2014-08-15: 15 mg via ORAL
  Filled 2014-08-15: qty 3

## 2014-08-15 NOTE — Progress Notes (Signed)
Patient ID: Carl Thomas, male   DOB: 12/31/1990, 24 y.o.   MRN: [id number]   LOS: 1 day   POD#10  Subjective: Feels marginally better   Objective: Vital signs in last 24 hours: Temp:  [99 F (37.2 C)-102.3 F (39.1 C)] 99 F (37.2 C) (07/19 0532) Pulse Rate:  [71-101] 71 (07/19 0532) Resp:  [16-18] 18 (07/19 0532) BP: (105-118)/(52-70) 118/70 mmHg (07/19 0532) SpO2:  [95 %-100 %] 99 % (07/19 0532) Last BM Date: 08/14/14   Laboratory  CBC  Recent Labs  08/14/14 0418 08/15/14 0407  WBC 12.3* 11.2*  HGB 11.6* 11.0*  HCT 33.4* 31.9*  PLT 295 334    Physical Exam General appearance: alert and no distress Resp: clear to auscultation bilaterally Cardio: regular rate and rhythm GI: normal findings: bowel sounds normal and soft, non-tender   Assessment/Plan: Perihepatic fluid collection -- Biloma vs abscess vs hematoma C diff colitis -- Vancomycin D2, WBC decreased, will give Florastor ABL anemia -- Stable FEN -- Change to oral pain meds, give diet VTE -- SCD's, Lovenox Dispo -- Diarrhea    Freeman CaldronMichael J. Harly Pipkins, PA-C Pager: [phone] General Trauma PA Pager: [phone]  08/15/2014

## 2014-08-15 NOTE — Progress Notes (Signed)
RN went into patient's room to give his 6pm medications. Upon arrival patient was gone from the room. IV was unhooked and fluids were running in the floor. There is no sign of patient on the floor. MD notified.

## 2014-08-16 LAB — URINE CULTURE

## 2014-08-19 LAB — CULTURE, BLOOD (ROUTINE X 2)
Culture: NO GROWTH
Culture: NO GROWTH

## 2014-08-23 ENCOUNTER — Other Ambulatory Visit: Payer: Self-pay | Admitting: Orthopedic Surgery

## 2014-08-23 DIAGNOSIS — R1084 Generalized abdominal pain: Secondary | ICD-10-CM
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
## 2014-10-03 ENCOUNTER — Emergency Department (HOSPITAL_COMMUNITY): Payer: Self-pay

## 2014-10-03 ENCOUNTER — Emergency Department (HOSPITAL_COMMUNITY)
Admission: EM | Admit: 2014-10-03 | Discharge: 2014-10-04 | Disposition: A | Discharge status: Home / Self Care | Payer: Self-pay | Attending: Emergency Medicine | Admitting: Emergency Medicine

## 2014-10-03 ENCOUNTER — Encounter (HOSPITAL_COMMUNITY): Payer: Self-pay | Admitting: *Deleted

## 2014-10-03 DIAGNOSIS — X58XXXA Exposure to other specified factors, initial encounter: Secondary | ICD-10-CM | POA: Insufficient documentation

## 2014-10-03 DIAGNOSIS — Z8669 Personal history of other diseases of the nervous system and sense organs: Secondary | ICD-10-CM | POA: Insufficient documentation

## 2014-10-03 DIAGNOSIS — S8991XA Unspecified injury of right lower leg, initial encounter: Secondary | ICD-10-CM | POA: Insufficient documentation

## 2014-10-03 DIAGNOSIS — Y990 Civilian activity done for income or pay: Secondary | ICD-10-CM | POA: Insufficient documentation

## 2014-10-03 DIAGNOSIS — Z8781 Personal history of (healed) traumatic fracture: Secondary | ICD-10-CM | POA: Insufficient documentation

## 2014-10-03 DIAGNOSIS — M25561 Pain in right knee: Secondary | ICD-10-CM

## 2014-10-03 DIAGNOSIS — Y9289 Other specified places as the place of occurrence of the external cause: Secondary | ICD-10-CM | POA: Insufficient documentation

## 2014-10-03 DIAGNOSIS — Z87828 Personal history of other (healed) physical injury and trauma: Secondary | ICD-10-CM | POA: Insufficient documentation

## 2014-10-03 DIAGNOSIS — S76011A Strain of muscle, fascia and tendon of right hip, initial encounter: Secondary | ICD-10-CM | POA: Insufficient documentation

## 2014-10-03 DIAGNOSIS — Z720 Tobacco use: Secondary | ICD-10-CM | POA: Insufficient documentation

## 2014-10-03 DIAGNOSIS — Z79899 Other long term (current) drug therapy: Secondary | ICD-10-CM | POA: Insufficient documentation

## 2014-10-03 DIAGNOSIS — Y9389 Activity, other specified: Secondary | ICD-10-CM | POA: Insufficient documentation

## 2014-10-03 NOTE — ED Notes (Signed)
Pt c/o pain and difficulty walking to right leg; pt states he felt something "pop" in his hip

## 2014-10-04 MED ORDER — NAPROXEN 500 MG PO TABS: 500.0000 mg | ORAL_TABLET | Freq: Two times a day (BID) | ORAL | Status: DC

## 2014-10-04 MED ORDER — TRAMADOL HCL 50 MG PO TABS: 50.0000 mg | ORAL_TABLET | Freq: Four times a day (QID) | ORAL | Status: DC | PRN

## 2014-10-04 MED ORDER — HYDROCODONE-ACETAMINOPHEN 5-325 MG PO TABS
1.0000 | ORAL_TABLET | Freq: Once | ORAL | Status: AC
  Administered 2014-10-04: 1 via ORAL
  Filled 2014-10-04: qty 1

## 2014-10-04 NOTE — ED Notes (Signed)
Discharge instructions given, pt stated he wanted work note to be able to stay home from work this week, stating pain made it "to hard to put weight on my leg". Explained discharge instructions and diagnosis on discharge paperwork, and also told pt that I would speak to PA about note. After discussing pt wishes with Burgess Amor, PA, a work note was given to pt allowing him to return to work on Thursday with no restrictions and pt was advised to return to ER if symptoms persisted or got worse. Otherwise pt was encouraged to follow up with orthopedics as ordered in discharge instructions. Pt stated he understood.

## 2014-10-04 NOTE — Discharge Instructions (Signed)
Hip Pain Your hip is the joint between your upper legs and your lower pelvis. The bones, cartilage, tendons, and muscles of your hip joint perform a lot of work each day supporting your body weight and allowing you to move around. Hip pain can range from a minor ache to severe pain in one or both of your hips. Pain may be felt on the inside of the hip joint near the groin, or the outside near the buttocks and upper thigh. You may have swelling or stiffness as well.  HOME CARE INSTRUCTIONS   Take medicines only as directed by your health care provider.  Apply ice to the injured area:  Put ice in a plastic bag.  Place a towel between your skin and the bag.  Leave the ice on for 15-20 minutes at a time, 3-4 times a day.  Keep your leg raised (elevated) when possible to lessen swelling.  Avoid activities that cause pain.  Follow specific exercises as directed by your health care provider.  Sleep with a pillow between your legs on your most comfortable side.  Record how often you have hip pain, the location of the pain, and what it feels like. SEEK MEDICAL CARE IF:   You are unable to put weight on your leg.  Your hip is red or swollen or very tender to touch.  Your pain or swelling continues or worsens after 1 week.  You have increasing difficulty walking.  You have a fever. SEEK IMMEDIATE MEDICAL CARE IF:   You have fallen.  You have a sudden increase in pain and swelling in your hip. MAKE SURE YOU:   Understand these instructions.  Will watch your condition.  Will get help right away if you are not doing well or get worse. Document Released: 07/03/2009 Document Revised: 05/30/2013 Document Reviewed: 09/09/2012 [facility] Patient Information 2015 [place], [place]. This information is not intended to replace advice given to you by your health care provider. Make sure you discuss any questions you have with your health care provider.   Your exam, xrays and nature of your  injury is suggestive of a hip tendon strain injury.  Use the instructions and medicines as prescribed.  Plan to followup with orthopedics if your symptoms persist or do not improve with this treatment.
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
## 2014-10-05 ENCOUNTER — Telehealth (HOSPITAL_COMMUNITY): Payer: Self-pay

## 2014-10-06 NOTE — Telephone Encounter (Signed)
LM on VM suggesting he needs f/u with primary care or neurology. He can call back if he has further questions.

## 2014-10-23 ENCOUNTER — Telehealth (HOSPITAL_COMMUNITY): Payer: Self-pay | Admitting: Emergency Medicine

## 2014-10-23 NOTE — Telephone Encounter (Signed)
Suggested pt get an appointment with [facility]. I provided him with the phone number.

## 2015-07-25 ENCOUNTER — Emergency Department (HOSPITAL_COMMUNITY)
Admission: EM | Admit: 2015-07-25 | Discharge: 2015-07-25 | Disposition: A | Discharge status: Home / Self Care | Payer: Self-pay | Attending: Emergency Medicine | Admitting: Emergency Medicine

## 2015-07-25 ENCOUNTER — Emergency Department (HOSPITAL_COMMUNITY): Payer: Self-pay

## 2015-07-25 ENCOUNTER — Encounter (HOSPITAL_COMMUNITY): Payer: Self-pay | Admitting: Emergency Medicine

## 2015-07-25 DIAGNOSIS — M25551 Pain in right hip: Secondary | ICD-10-CM | POA: Insufficient documentation

## 2015-07-25 DIAGNOSIS — G8929 Other chronic pain: Secondary | ICD-10-CM | POA: Insufficient documentation

## 2015-07-25 DIAGNOSIS — R1084 Generalized abdominal pain: Secondary | ICD-10-CM | POA: Insufficient documentation

## 2015-07-25 DIAGNOSIS — F1290 Cannabis use, unspecified, uncomplicated: Secondary | ICD-10-CM | POA: Insufficient documentation

## 2015-07-25 DIAGNOSIS — R112 Nausea with vomiting, unspecified: Secondary | ICD-10-CM | POA: Insufficient documentation

## 2015-07-25 DIAGNOSIS — R109 Unspecified abdominal pain: Secondary | ICD-10-CM

## 2015-07-25 DIAGNOSIS — F17210 Nicotine dependence, cigarettes, uncomplicated: Secondary | ICD-10-CM | POA: Insufficient documentation

## 2015-07-25 LAB — COMPREHENSIVE METABOLIC PANEL
ALK PHOS: 81 U/L (ref 38–126)
ALT: 16 U/L — AB (ref 17–63)
AST: 19 U/L (ref 15–41)
Albumin: 4.3 g/dL (ref 3.5–5.0)
Anion gap: 3 — ABNORMAL LOW (ref 5–15)
BILIRUBIN TOTAL: 0.5 mg/dL (ref 0.3–1.2)
BUN: 16 mg/dL (ref 6–20)
CALCIUM: 8.7 mg/dL — AB (ref 8.9–10.3)
CO2: 31 mmol/L (ref 22–32)
CREATININE: 0.86 mg/dL (ref 0.61–1.24)
Chloride: 103 mmol/L (ref 101–111)
Glucose, Bld: 85 mg/dL (ref 65–99)
Potassium: 3.8 mmol/L (ref 3.5–5.1)
Sodium: 137 mmol/L (ref 135–145)
TOTAL PROTEIN: 7.3 g/dL (ref 6.5–8.1)

## 2015-07-25 LAB — CBC
HCT: 41.4 % (ref 39.0–52.0)
Hemoglobin: 14.5 g/dL (ref 13.0–17.0)
MCH: 30.1 pg (ref 26.0–34.0)
MCHC: 35 g/dL (ref 30.0–36.0)
MCV: 86.1 fL (ref 78.0–100.0)
PLATELETS: 202 10*3/uL (ref 150–400)
RBC: 4.81 MIL/uL (ref 4.22–5.81)
RDW: 12.8 % (ref 11.5–15.5)
WBC: 7.6 10*3/uL (ref 4.0–10.5)

## 2015-07-25 LAB — URINALYSIS, ROUTINE W REFLEX MICROSCOPIC
Bilirubin Urine: NEGATIVE
Glucose, UA: NEGATIVE mg/dL
Hgb urine dipstick: NEGATIVE
KETONES UR: NEGATIVE mg/dL
LEUKOCYTES UA: NEGATIVE
Nitrite: NEGATIVE
PROTEIN: 30 mg/dL — AB
Specific Gravity, Urine: >1.03 — ABNORMAL HIGH (ref 1.005–1.030)
pH: 6 (ref 5.0–8.0)

## 2015-07-25 LAB — URINE MICROSCOPIC-ADD ON: RBC / HPF: NONE SEEN RBC/hpf (ref 0–5)

## 2015-07-25 LAB — LIPASE, BLOOD: LIPASE: 21 U/L (ref 11–51)

## 2015-07-25 MED ORDER — IOPAMIDOL (ISOVUE-300) INJECTION 61%
100.0000 mL | Freq: Once | INTRAVENOUS | Status: AC | PRN
  Administered 2015-07-25: 100 mL via INTRAVENOUS

## 2015-07-25 MED ORDER — ONDANSETRON HCL 4 MG/2ML IJ SOLN
4.0000 mg | INTRAMUSCULAR | Status: DC | PRN
  Administered 2015-07-25: 4 mg via INTRAVENOUS
  Filled 2015-07-25: qty 2

## 2015-07-25 MED ORDER — SODIUM CHLORIDE 0.9 % IV SOLN
INTRAVENOUS | Status: DC
  Administered 2015-07-25: 18:00:00 via INTRAVENOUS

## 2015-07-25 MED ORDER — HYDROCODONE-ACETAMINOPHEN 5-325 MG PO TABS: ORAL_TABLET | ORAL | Status: DC

## 2015-07-25 MED ORDER — FENTANYL CITRATE (PF) 100 MCG/2ML IJ SOLN
50.0000 ug | INTRAMUSCULAR | Status: DC | PRN
  Administered 2015-07-25: 50 ug via INTRAVENOUS
  Filled 2015-07-25: qty 2

## 2015-07-25 NOTE — Discharge Instructions (Signed)
Take the prescriptions as directed.  Apply moist heat or ice to the area(s) of discomfort, for 15 minutes at a time, several times per day for the next few days.  Do not fall asleep on a heating or ice pack.  Call your regular medical doctor and the Neurologist tomorrow to schedule a follow up appointment within the next week.  Return to the Emergency Department immediately if worsening.
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
## 2015-07-25 NOTE — ED Notes (Signed)
Pt eating fast food brought by family. No complaints at this time.
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
## 2016-09-30 ENCOUNTER — Emergency Department (HOSPITAL_COMMUNITY)
Admission: EM | Admit: 2016-09-30 | Discharge: 2016-09-30 | Disposition: A | Discharge status: Home / Self Care | Payer: Self-pay | Attending: Emergency Medicine | Admitting: Emergency Medicine

## 2016-09-30 ENCOUNTER — Encounter (HOSPITAL_COMMUNITY): Payer: Self-pay | Admitting: Emergency Medicine

## 2016-09-30 DIAGNOSIS — L739 Follicular disorder, unspecified: Secondary | ICD-10-CM | POA: Insufficient documentation

## 2016-09-30 DIAGNOSIS — F17210 Nicotine dependence, cigarettes, uncomplicated: Secondary | ICD-10-CM | POA: Insufficient documentation

## 2016-09-30 MED ORDER — SULFAMETHOXAZOLE-TRIMETHOPRIM 800-160 MG PO TABS: 1.0000 | ORAL_TABLET | Freq: Two times a day (BID) | ORAL | 0 refills | Status: AC

## 2016-09-30 MED ORDER — HYDROCODONE-ACETAMINOPHEN 5-325 MG PO TABS
ORAL_TABLET | ORAL | 0 refills | Status: DC
Start: 2016-09-30 — End: 2017-01-14

## 2016-09-30 MED ORDER — IBUPROFEN 800 MG PO TABS: 800.0000 mg | ORAL_TABLET | Freq: Three times a day (TID) | ORAL | 0 refills | Status: AC

## 2016-09-30 NOTE — ED Triage Notes (Signed)
Patient c/o abscess to left groin/scrotum. Per patient noticed it on and is progressively getting worse. Denies any fevers or drainage.

## 2016-09-30 NOTE — Discharge Instructions (Signed)
Warm, wet compresses or soaks 2-3 times a day.  Avoid squeezing.  Return here in 2-3 days if not improving
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
## 2016-10-09 ENCOUNTER — Ambulatory Visit (HOSPITAL_COMMUNITY)
Admission: EM | Admit: 2016-10-09 | Discharge: 2016-10-09 | Disposition: A | Discharge status: Home / Self Care | Payer: Worker's Compensation | Attending: Family Medicine | Admitting: Family Medicine

## 2016-10-09 ENCOUNTER — Encounter (HOSPITAL_COMMUNITY): Payer: Self-pay | Admitting: Emergency Medicine

## 2016-10-09 DIAGNOSIS — S61210A Laceration without foreign body of right index finger without damage to nail, initial encounter: Secondary | ICD-10-CM | POA: Diagnosis not present
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
## 2016-10-09 NOTE — ED Notes (Signed)
Pan of cold water provided for soaking of hand with wound,  Complains of water stinging

## 2016-10-09 NOTE — ED Notes (Signed)
Patient verbalized understanding of discharge instructions and denies any further needs or questions at this time. VS stable. Patient ambulatory with steady gait.  

## 2016-10-09 NOTE — Discharge Instructions (Signed)
Follow up in 7 days for suture removal. Please read instructions regarding sutured wound care.

## 2016-10-14 NOTE — ED Provider Notes (Signed)
  [facility]   [id number] 10/09/16 Arrival Time: 1548  ASSESSMENT & PLAN:  1. Laceration of right index finger without foreign body without damage to nail, initial encounter    Procedure: Verbal consent obtained. Patient provided with risks and alternatives to the procedure. Wound copiously irrigated with NS then cleansed with betadine. Anesthetized with 2 mL of lidocaine without epinephrine after LET. Wound carefully explored. No foreign body, tendon injury, or nonviable tissue were noted. Using sterile technique 4 simple interrupted 5-0 Prolene sutures were placed to reapproximate the wound. Patient tolerated procedure well. No complications. Minimal bleeding. Patient advised to look for and return for any signs of infection such as redness, swelling, discharge, or worsening pain. Return for suture removal in 7 days.   Reviewed expectations re: course of current medical issues. Questions answered. Outlined signs and symptoms indicating need for more acute intervention. Patient verbalized understanding. After Visit Summary given.   SUBJECTIVE:  Carl Thomas is a 26 y.o. male who presents with a laceration of R index finger. Cut today with box cutter. Moderate bleeding. Now controlled. No ROM loss. No PMH of R finger injury reported.  Td UTD: Yes  ROS: As per HPI.   OBJECTIVE:  Vitals:   10/09/16 1651  BP: 124/79  Pulse: 83  Resp: 18  Temp: 98.4 F (36.9 C)  TempSrc: Oral  SpO2: 99%     General appearance: alert; no distress Skin: R index finger with a 1.5cm laceration at MCP; no bleeding; no tendon injury; FROM; normal sensation and capillary refill Psychological: alert and cooperative; normal mood and affect  No Known Allergies       Mardella Layman, MD 10/14/16 [phone]

## 2017-01-14 ENCOUNTER — Emergency Department (HOSPITAL_COMMUNITY): Payer: Self-pay

## 2017-01-14 ENCOUNTER — Encounter (HOSPITAL_COMMUNITY): Payer: Self-pay | Admitting: Emergency Medicine

## 2017-01-14 ENCOUNTER — Other Ambulatory Visit: Payer: Self-pay

## 2017-01-14 ENCOUNTER — Emergency Department (HOSPITAL_COMMUNITY)
Admission: EM | Admit: 2017-01-14 | Discharge: 2017-01-15 | Disposition: A | Discharge status: Home / Self Care | Payer: Self-pay | Attending: Emergency Medicine | Admitting: Emergency Medicine

## 2017-01-14 DIAGNOSIS — Y9389 Activity, other specified: Secondary | ICD-10-CM | POA: Insufficient documentation

## 2017-01-14 DIAGNOSIS — S61412A Laceration without foreign body of left hand, initial encounter: Secondary | ICD-10-CM | POA: Insufficient documentation

## 2017-01-14 DIAGNOSIS — W268XXA Contact with other sharp object(s), not elsewhere classified, initial encounter: Secondary | ICD-10-CM | POA: Insufficient documentation

## 2017-01-14 DIAGNOSIS — Y929 Unspecified place or not applicable: Secondary | ICD-10-CM | POA: Insufficient documentation

## 2017-01-14 DIAGNOSIS — Z79899 Other long term (current) drug therapy: Secondary | ICD-10-CM | POA: Insufficient documentation

## 2017-01-14 DIAGNOSIS — F17210 Nicotine dependence, cigarettes, uncomplicated: Secondary | ICD-10-CM | POA: Insufficient documentation

## 2017-01-14 DIAGNOSIS — Y999 Unspecified external cause status: Secondary | ICD-10-CM | POA: Insufficient documentation

## 2017-01-14 MED ORDER — POVIDONE-IODINE 10 % EX SOLN
CUTANEOUS | Status: AC
  Filled 2017-01-14: qty 45

## 2017-01-14 MED ORDER — HYDROCODONE-ACETAMINOPHEN 5-325 MG PO TABS
2.0000 | ORAL_TABLET | Freq: Once | ORAL | Status: AC
  Administered 2017-01-14: 2 via ORAL
  Filled 2017-01-14: qty 2

## 2017-01-14 MED ORDER — PROMETHAZINE HCL 12.5 MG PO TABS
12.5000 mg | ORAL_TABLET | Freq: Once | ORAL | Status: AC
  Administered 2017-01-14: 12.5 mg via ORAL
  Filled 2017-01-14: qty 1

## 2017-01-14 MED ORDER — LIDOCAINE HCL (PF) 1 % IJ SOLN
10.0000 mL | Freq: Once | INTRAMUSCULAR | Status: DC
  Filled 2017-01-14: qty 10
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
## 2017-01-14 NOTE — ED Triage Notes (Signed)
Pt cut left hand to left proximal knuckle of middle finger  and left pointer finger. Bleeding controlled. Cut it on sheet metal. Denies wanting workers comp

## 2017-01-14 NOTE — Discharge Instructions (Signed)
Please keep your wound clean and dry. Have your sutures removed in 7 days. Please see your primary MD or return to the ED if any sign of infection. Change dressing/bandage daily.

## 2017-12-01 ENCOUNTER — Encounter (HOSPITAL_COMMUNITY): Payer: Self-pay | Admitting: Emergency Medicine

## 2017-12-01 ENCOUNTER — Other Ambulatory Visit: Payer: Self-pay

## 2017-12-01 ENCOUNTER — Emergency Department (HOSPITAL_COMMUNITY)
Admission: EM | Admit: 2017-12-01 | Discharge: 2017-12-01 | Disposition: A | Discharge status: Home / Self Care | Payer: Self-pay | Attending: Emergency Medicine | Admitting: Emergency Medicine

## 2017-12-01 DIAGNOSIS — K047 Periapical abscess without sinus: Secondary | ICD-10-CM | POA: Insufficient documentation

## 2017-12-01 DIAGNOSIS — F1290 Cannabis use, unspecified, uncomplicated: Secondary | ICD-10-CM | POA: Insufficient documentation

## 2017-12-01 DIAGNOSIS — F17210 Nicotine dependence, cigarettes, uncomplicated: Secondary | ICD-10-CM | POA: Insufficient documentation

## 2017-12-01 MED ORDER — PENICILLIN V POTASSIUM 500 MG PO TABS: 500.0000 mg | ORAL_TABLET | Freq: Four times a day (QID) | ORAL | 0 refills | Status: AC

## 2017-12-01 MED ORDER — OXYCODONE-ACETAMINOPHEN 5-325 MG PO TABS
1.0000 | ORAL_TABLET | Freq: Once | ORAL | Status: AC
  Administered 2017-12-01: 1 via ORAL
  Filled 2017-12-01: qty 1
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
## 2017-12-01 NOTE — Discharge Instructions (Addendum)
I have prescribed antibiotics for your pain please take 1 tablet 4 times a day for the following 7 days.  If you experience any fever, pain with eye movement, difficulty swallowing or tolerating her secretions please return to the ED for reevaluation.

## 2017-12-01 NOTE — ED Triage Notes (Signed)
Pt states he has an infection in a tooth on the R lower side. States he attempted to pull his tooth with pliers and something "nasty and smelly and bad tasting" came out. Went to a Education officer, community and referred here for antibiotics.

## 2019-07-03 IMAGING — DX DG HAND COMPLETE 3+V*L*
3 series · 3 of 3 positions shown · non-contrast
Comparison: None.

CLINICAL DATA: Hand lacerated on sheet metal

EXAM:
LEFT HAND - COMPLETE 3+ VIEW

[hand pa]
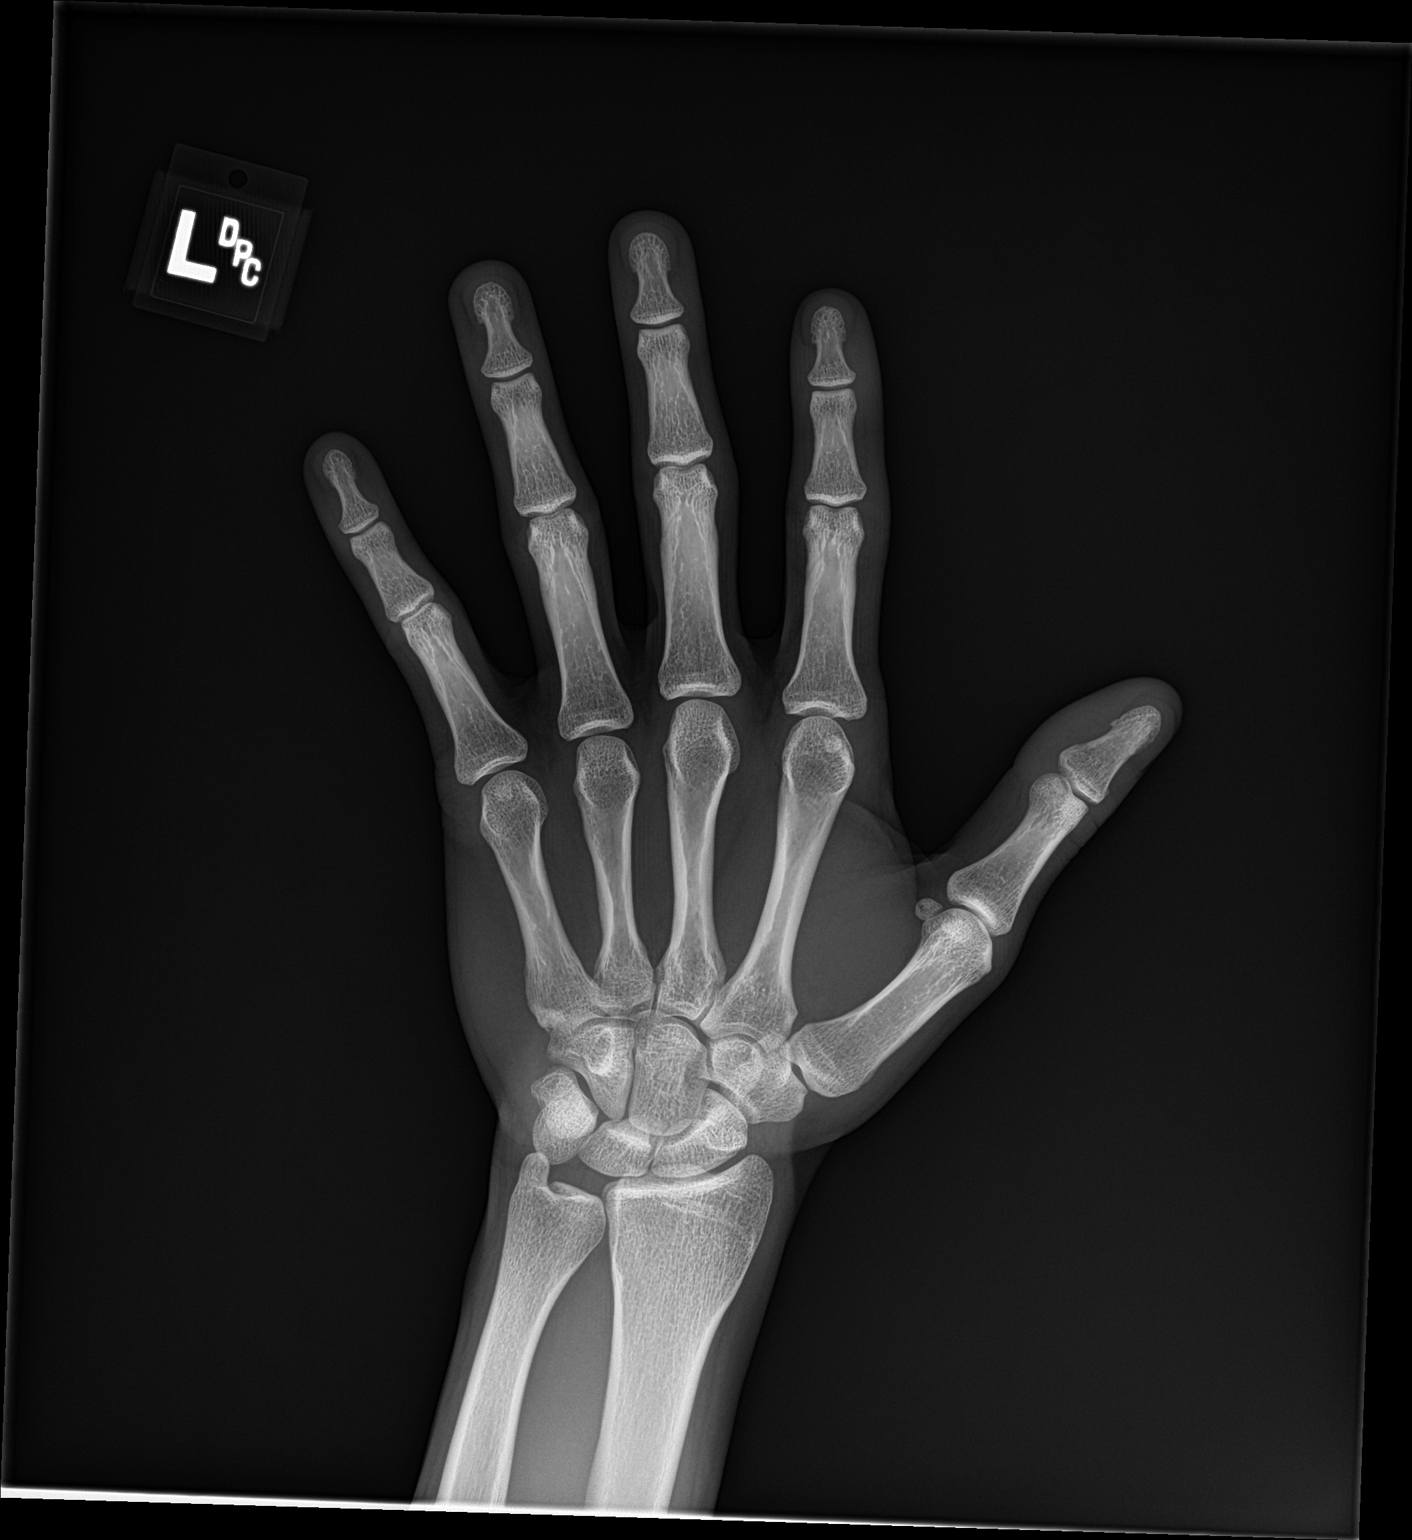

[hand obl]
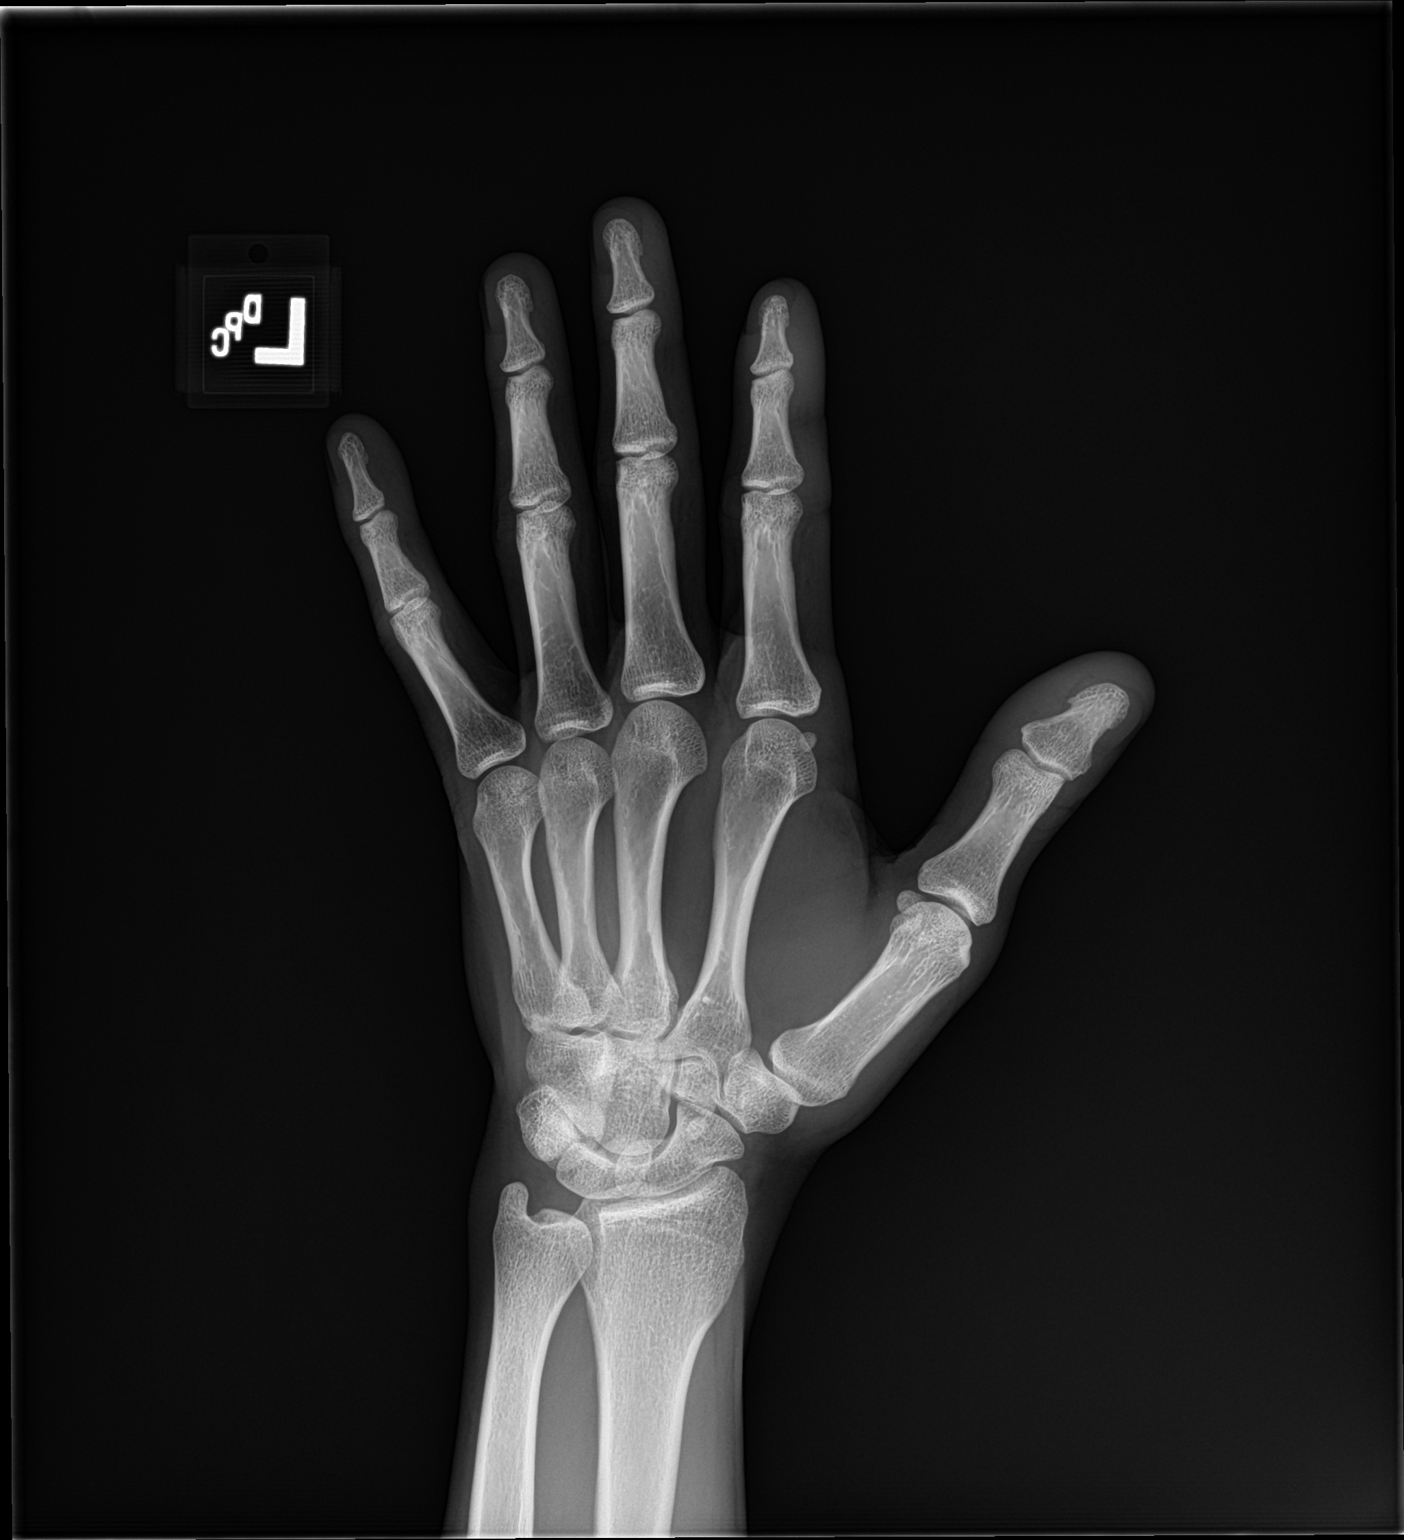

[hand lat]
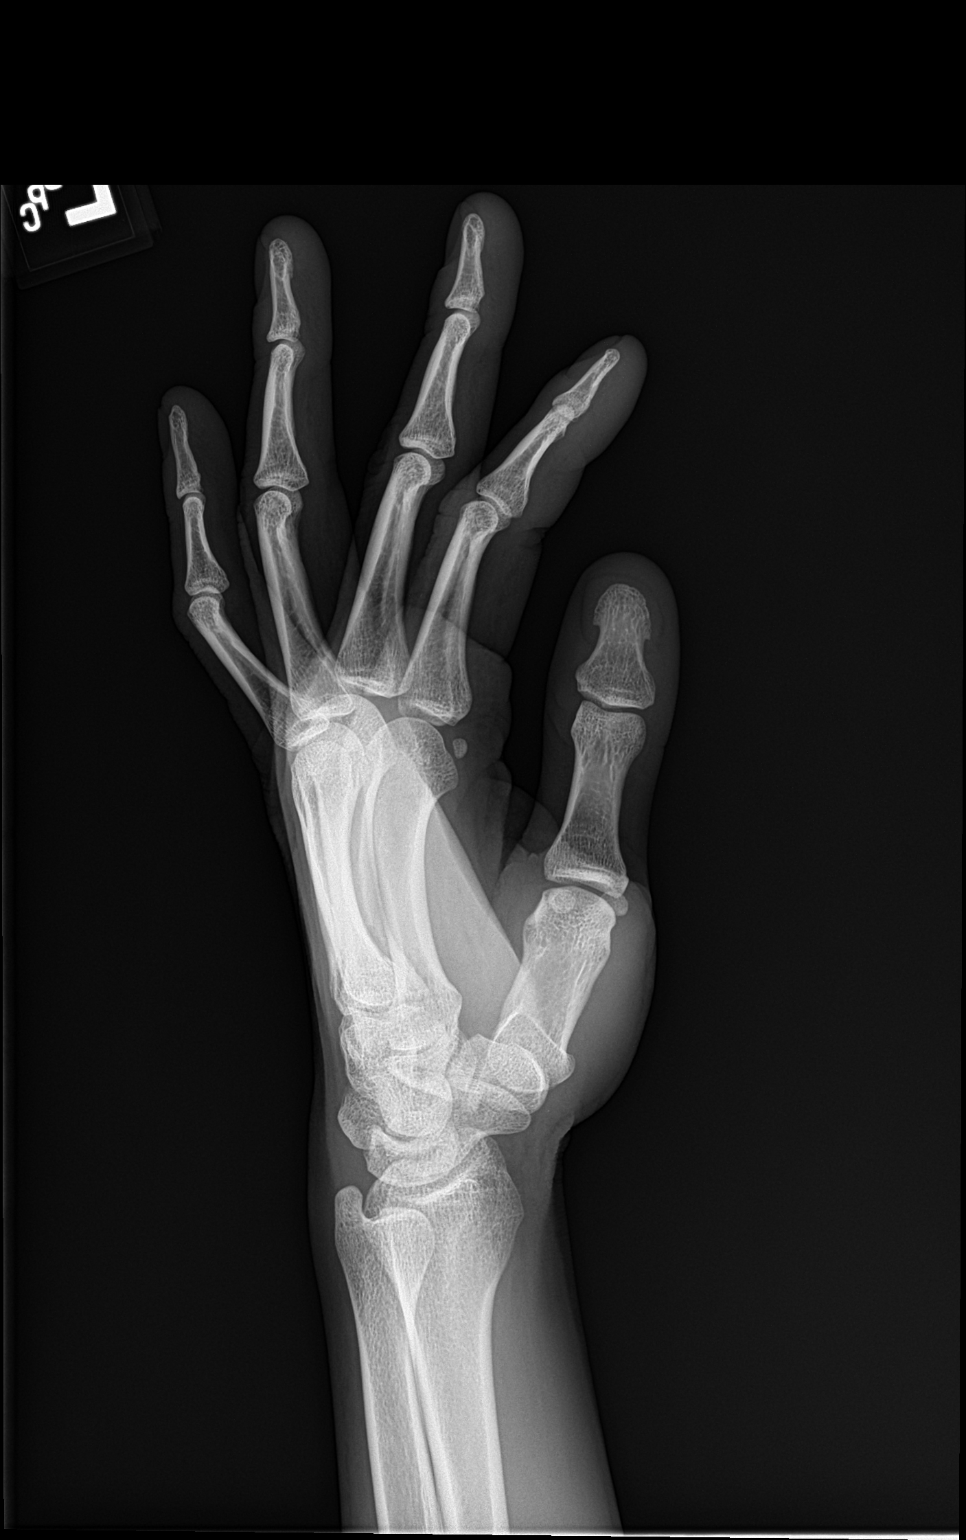

[3 of 3 positions shown; findings below may reference images not displayed]

FINDINGS: Frontal, oblique, and lateral views were obtained. No fracture or
dislocation. Joint spaces appear normal. No erosive change. No soft
tissue air or radiopaque foreign body.
IMPRESSION: No soft tissue air or radiopaque foreign body. No fracture or
dislocation. No evident arthropathy.

## 2020-12-03 ENCOUNTER — Other Ambulatory Visit: Payer: Self-pay

## 2020-12-03 ENCOUNTER — Encounter (HOSPITAL_COMMUNITY): Payer: Self-pay

## 2020-12-03 ENCOUNTER — Emergency Department (HOSPITAL_COMMUNITY)
Admission: EM | Admit: 2020-12-03 | Discharge: 2020-12-03 | Disposition: A | Discharge status: Home / Self Care | Payer: Self-pay | Attending: Emergency Medicine | Admitting: Emergency Medicine

## 2020-12-03 DIAGNOSIS — F17210 Nicotine dependence, cigarettes, uncomplicated: Secondary | ICD-10-CM | POA: Insufficient documentation

## 2020-12-03 DIAGNOSIS — L255 Unspecified contact dermatitis due to plants, except food: Secondary | ICD-10-CM

## 2020-12-03 MED ORDER — PREDNISONE 20 MG PO TABS: 40.0000 mg | ORAL_TABLET | Freq: Every day | ORAL | 0 refills | Status: DC

## 2020-12-03 MED ORDER — DIPHENHYDRAMINE HCL 25 MG PO CAPS
25.0000 mg | ORAL_CAPSULE | Freq: Once | ORAL | Status: AC
  Administered 2020-12-03: 25 mg via ORAL
  Filled 2020-12-03: qty 1

## 2020-12-03 MED ORDER — DEXAMETHASONE SODIUM PHOSPHATE 10 MG/ML IJ SOLN
10.0000 mg | Freq: Once | INTRAMUSCULAR | Status: AC
Start: 2020-12-03 — End: 2020-12-03
  Administered 2020-12-03: 10 mg via INTRAMUSCULAR
  Filled 2020-12-03: qty 1
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
## 2021-05-19 ENCOUNTER — Other Ambulatory Visit: Payer: Self-pay

## 2021-05-19 ENCOUNTER — Encounter (HOSPITAL_COMMUNITY): Payer: Self-pay | Admitting: Emergency Medicine

## 2021-05-19 ENCOUNTER — Emergency Department (HOSPITAL_COMMUNITY)
Admission: EM | Admit: 2021-05-19 | Discharge: 2021-05-19 | Disposition: A | Discharge status: Home / Self Care | Payer: BC Managed Care – PPO | Attending: Emergency Medicine | Admitting: Emergency Medicine

## 2021-05-19 DIAGNOSIS — R21 Rash and other nonspecific skin eruption: Secondary | ICD-10-CM | POA: Diagnosis present

## 2021-05-19 DIAGNOSIS — L237 Allergic contact dermatitis due to plants, except food: Secondary | ICD-10-CM | POA: Insufficient documentation

## 2021-05-19 MED ORDER — PREDNISONE 10 MG PO TABS: ORAL_TABLET | ORAL | 0 refills | Status: AC

## 2021-05-19 MED ORDER — DEXAMETHASONE SODIUM PHOSPHATE 10 MG/ML IJ SOLN
10.0000 mg | Freq: Once | INTRAMUSCULAR | Status: AC
  Administered 2021-05-19: 10 mg via INTRAMUSCULAR
  Filled 2021-05-19: qty 1

## 2021-05-19 NOTE — ED Provider Notes (Signed)
?[facility] ?Provider Note ? ? ?CSN: [id number] ?Arrival date & time: 05/19/21  1131 ? ?  ? ?History ? ?Chief Complaint  ?Patient presents with  ? Poison Ivy  ? ? ?Carl Thomas is a 31 y.o. 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  He takes Benadryl at night to help with the itch, this is effective but it makes him drowsy and cannot take during the daytime.  He states he has tried various OTC creams including calamine lotion in the past which has never helped him with this allergic reaction, stating he always needs a steroid shot along with a long prednisone taper to resolve symptoms.  He denies shortness of breath, cough.  No fevers, no other complaints. ?The history is provided by the patient.  ? ?  ? ?Home Medications ?Prior to Admission medications   ?Medication Sig Start Date End Date Taking? Authorizing Provider  ?predniSONE (DELTASONE) 10 MG tablet Take 6 tabs by mouth daily  for 2 days, then 5 tabs for 2 days, then 4 tabs for 2 days, then 3 tabs for 2 days, 2 tabs for 2 days, then 1 tab by mouth daily for 2 days 05/19/21  Yes Jeric Slagel, Raynelle Fanning, PA-C  ?ibuprofen (ADVIL,MOTRIN) 800 MG tablet Take 1 tablet (800 mg total) by mouth 3 (three) times daily. 09/30/16   Triplett, Tammy, PA-C  ?   ? ?Allergies    ?Poison ivy extract and Poison oak extract   ? ?Review of Systems   ?Review of Systems  ?Constitutional:  Negative for chills and fever.  ?Respiratory:  Negative for shortness of breath and wheezing.   ?Skin:  Positive for rash.  ?Neurological:  Negative for numbness.  ?All other systems reviewed and are negative. ? ?Physical Exam ?Updated Vital Signs ?BP 122/76 (BP Location: Right Arm)   Pulse 92   Temp 98.5 ?F (36.9 ?C)   Resp 18   Ht 6' (1.829 m)   Wt 72.6 kg   SpO2 100%   BMI 21.70 kg/m?   ?Physical Exam ?Constitutional:   ?   General: He is not in acute distress. ?   Appearance: He is well-developed.  ?HENT:  ?   Head: Normocephalic.  ?Cardiovascular:  ?   Rate and Rhythm: Normal rate.  ?Pulmonary:  ?   Effort: Pulmonary effort is normal.  ?   Breath sounds: No wheezing.  ?Musculoskeletal:     ?   General: Normal range of motion.  ?   Cervical back: Neck supple.  ?Skin: ?   Findings: Rash present.  ?   Comments: Small scattered vesicular lesions on bilateral arms and right chest.  He has some mild macular erythema beneath his right eye without vesicles, no globe involvement.  No induration or edema of the face.  ? ? ?ED Results / Procedures / Treatments   ?Labs ?(all labs ordered are listed, but only abnormal results are displayed) ?Labs Reviewed - No data to display ? ?EKG ?None ? ?Radiology ?No results found. ? ?Procedures ?Procedures  ? ? ?Medications Ordered in ED ?Medications  ?dexamethasone (DECADRON) injection 10 mg (10 mg Intramuscular Given 05/19/21 1206)  ? ? ?ED Course/ Medical Decision Making/ A&P ?  ?                        ?  Medical Decision Making ?Patient with obvious poison ivy rash with known exposure.  He was given an IM dose of Decadron, placed on a 12-day 60 mg to 10 mg prednisone taper.  We discussed other home treatments for symptom relief.  Parent follow-up anticipated.  Afebrile, rashes not tender, no evidence of superimposed bacterial infection. ? ?Risk ?Prescription drug management. ? ? ? ? ? ? ? ? ? ? ?Final Clinical Impression(s) / ED Diagnoses ?Final diagnoses:  ?Poison ivy dermatitis  ? ? ?Rx / DC Orders ?ED Discharge Orders   ? ?      Ordered  ?  predniSONE (DELTASONE) 10 MG tablet       ? 05/19/21 1204  ? ?  ?  ? ?  ? ? ?  ?Burgess Amor, PA-C ?05/19/21 1213 ? ?  ?Eber Hong, MD ?05/24/21 [phone] ? ?

## 2021-05-19 NOTE — Discharge Instructions (Signed)
Start the prednisone taper tomorrow.  Get rechecked for any worsening symptoms.  You may also try either Claritin or Zyrtec for daytime antihistamine/itch relief since Benadryl makes you drowsy. ?

## 2021-05-19 NOTE — ED Triage Notes (Signed)
Patient c/o rash from poison ivy due to exposure on Friday. Patient works at [employer]. Patient has multiple areas on body. Per patient starting to affect right eye with some blurred vision. Patient states "starting to drain." Denies any fevers or other symptoms.  ?

## 2021-07-11 ENCOUNTER — Encounter (HOSPITAL_COMMUNITY): Payer: Self-pay | Admitting: Licensed Clinical Social Worker

## 2021-07-11 ENCOUNTER — Ambulatory Visit (INDEPENDENT_AMBULATORY_CARE_PROVIDER_SITE_OTHER): Payer: No Payment, Other | Admitting: Licensed Clinical Social Worker

## 2021-07-11 DIAGNOSIS — F411 Generalized anxiety disorder: Secondary | ICD-10-CM

## 2021-07-11 DIAGNOSIS — F331 Major depressive disorder, recurrent, moderate: Secondary | ICD-10-CM | POA: Insufficient documentation
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
